# Patient Record
Sex: Female | Born: 1940 | Race: White | Hispanic: No | Smoking: Current every day smoker
Health system: Southern US, Community
[De-identification: ages and names within clinical notes are randomized; demographics above are authoritative.]

## PROBLEM LIST (undated history)

## (undated) DIAGNOSIS — M810 Age-related osteoporosis without current pathological fracture: Secondary | ICD-10-CM

## (undated) DIAGNOSIS — Z8540 Personal history of malignant neoplasm of unspecified female genital organ: Secondary | ICD-10-CM

## (undated) DIAGNOSIS — I739 Peripheral vascular disease, unspecified: Secondary | ICD-10-CM

## (undated) DIAGNOSIS — M4800 Spinal stenosis, site unspecified: Secondary | ICD-10-CM

## (undated) DIAGNOSIS — C801 Malignant (primary) neoplasm, unspecified: Secondary | ICD-10-CM

## (undated) DIAGNOSIS — E7800 Pure hypercholesterolemia, unspecified: Secondary | ICD-10-CM

## (undated) DIAGNOSIS — I10 Essential (primary) hypertension: Secondary | ICD-10-CM

## (undated) HISTORY — PX: ABDOMINAL HYSTERECTOMY: SHX81

## (undated) HISTORY — PX: OOPHORECTOMY: SHX86

---

## 1968-06-04 HISTORY — PX: BREAST BIOPSY: SHX20

## 2004-07-19 ENCOUNTER — Ambulatory Visit: Payer: Self-pay | Admitting: Oncology

## 2005-01-15 ENCOUNTER — Ambulatory Visit: Payer: Self-pay | Admitting: Oncology

## 2005-02-02 ENCOUNTER — Ambulatory Visit: Payer: Self-pay | Admitting: Oncology

## 2005-08-20 ENCOUNTER — Ambulatory Visit: Payer: Self-pay | Admitting: Oncology

## 2005-08-21 ENCOUNTER — Ambulatory Visit: Payer: Self-pay | Admitting: Unknown Physician Specialty

## 2005-08-23 ENCOUNTER — Ambulatory Visit: Payer: Self-pay | Admitting: Oncology

## 2006-02-19 ENCOUNTER — Ambulatory Visit: Payer: Self-pay | Admitting: Oncology

## 2006-03-04 ENCOUNTER — Ambulatory Visit: Payer: Self-pay | Admitting: Oncology

## 2006-08-29 ENCOUNTER — Ambulatory Visit: Payer: Self-pay | Admitting: Oncology

## 2006-09-03 ENCOUNTER — Ambulatory Visit: Payer: Self-pay | Admitting: Oncology

## 2007-02-03 ENCOUNTER — Ambulatory Visit: Payer: Self-pay | Admitting: Oncology

## 2007-02-20 ENCOUNTER — Ambulatory Visit: Payer: Self-pay | Admitting: Oncology

## 2007-03-05 ENCOUNTER — Ambulatory Visit: Payer: Self-pay | Admitting: Oncology

## 2007-12-03 ENCOUNTER — Ambulatory Visit: Payer: Self-pay | Admitting: Internal Medicine

## 2008-10-12 ENCOUNTER — Ambulatory Visit: Payer: Self-pay | Admitting: Cardiovascular Disease

## 2008-11-17 ENCOUNTER — Ambulatory Visit: Payer: Self-pay | Admitting: Internal Medicine

## 2008-12-28 ENCOUNTER — Ambulatory Visit: Payer: Self-pay | Admitting: Internal Medicine

## 2010-01-03 ENCOUNTER — Ambulatory Visit: Payer: Self-pay | Admitting: Internal Medicine

## 2011-01-31 ENCOUNTER — Ambulatory Visit: Payer: Self-pay | Admitting: Internal Medicine

## 2012-02-26 ENCOUNTER — Ambulatory Visit: Payer: Self-pay | Admitting: Internal Medicine

## 2013-02-26 ENCOUNTER — Ambulatory Visit: Payer: Self-pay | Admitting: Internal Medicine

## 2014-03-01 ENCOUNTER — Ambulatory Visit: Payer: Self-pay | Admitting: Internal Medicine

## 2015-02-21 ENCOUNTER — Other Ambulatory Visit: Payer: Self-pay | Admitting: Internal Medicine

## 2015-02-21 DIAGNOSIS — Z1231 Encounter for screening mammogram for malignant neoplasm of breast: Secondary | ICD-10-CM

## 2015-03-03 ENCOUNTER — Ambulatory Visit: Payer: Self-pay

## 2015-03-07 ENCOUNTER — Ambulatory Visit
Admission: RE | Admit: 2015-03-07 | Discharge: 2015-03-07 | Disposition: A | Discharge status: Home / Self Care | Payer: Medicare HMO | Source: Ambulatory Visit | Attending: Internal Medicine | Admitting: Internal Medicine

## 2015-03-07 ENCOUNTER — Other Ambulatory Visit: Payer: Self-pay | Admitting: Internal Medicine

## 2015-03-07 DIAGNOSIS — Z1231 Encounter for screening mammogram for malignant neoplasm of breast: Secondary | ICD-10-CM | POA: Insufficient documentation

## 2015-03-07 HISTORY — DX: Malignant (primary) neoplasm, unspecified: C80.1

## 2015-03-07 HISTORY — DX: Personal history of malignant neoplasm of unspecified female genital organ: Z85.40

## 2015-03-08 ENCOUNTER — Other Ambulatory Visit: Payer: Self-pay | Admitting: Vascular Surgery

## 2015-03-09 ENCOUNTER — Ambulatory Visit
Admission: RE | Admit: 2015-03-09 | Discharge: 2015-03-09 | Disposition: A | Discharge status: Home / Self Care | Payer: Medicare HMO | Source: Ambulatory Visit | Attending: Vascular Surgery | Admitting: Vascular Surgery

## 2015-03-09 ENCOUNTER — Encounter
Admission: RE | Disposition: A | Discharge status: Home / Self Care | Payer: Self-pay | Source: Ambulatory Visit | Attending: Vascular Surgery

## 2015-03-09 ENCOUNTER — Encounter: Payer: Self-pay | Admitting: *Deleted

## 2015-03-09 DIAGNOSIS — I10 Essential (primary) hypertension: Secondary | ICD-10-CM | POA: Insufficient documentation

## 2015-03-09 DIAGNOSIS — I70223 Atherosclerosis of native arteries of extremities with rest pain, bilateral legs: Secondary | ICD-10-CM | POA: Diagnosis not present

## 2015-03-09 DIAGNOSIS — Z79899 Other long term (current) drug therapy: Secondary | ICD-10-CM | POA: Insufficient documentation

## 2015-03-09 DIAGNOSIS — M4806 Spinal stenosis, lumbar region: Secondary | ICD-10-CM | POA: Diagnosis not present

## 2015-03-09 DIAGNOSIS — E785 Hyperlipidemia, unspecified: Secondary | ICD-10-CM | POA: Insufficient documentation

## 2015-03-09 DIAGNOSIS — F17210 Nicotine dependence, cigarettes, uncomplicated: Secondary | ICD-10-CM | POA: Diagnosis not present

## 2015-03-09 DIAGNOSIS — M810 Age-related osteoporosis without current pathological fracture: Secondary | ICD-10-CM | POA: Diagnosis not present

## 2015-03-09 DIAGNOSIS — Z7982 Long term (current) use of aspirin: Secondary | ICD-10-CM | POA: Diagnosis not present

## 2015-03-09 HISTORY — DX: Peripheral vascular disease, unspecified: I73.9

## 2015-03-09 HISTORY — DX: Spinal stenosis, site unspecified: M48.00

## 2015-03-09 HISTORY — DX: Pure hypercholesterolemia, unspecified: E78.00

## 2015-03-09 HISTORY — DX: Age-related osteoporosis without current pathological fracture: M81.0

## 2015-03-09 HISTORY — DX: Essential (primary) hypertension: I10

## 2015-03-09 HISTORY — PX: PERIPHERAL VASCULAR CATHETERIZATION: SHX172C

## 2015-03-09 LAB — CREATININE, SERUM: CREATININE: 0.8 mg/dL (ref 0.44–1.00)

## 2015-03-09 LAB — BUN: BUN: 15 mg/dL (ref 6–20)

## 2015-03-09 SURGERY — ABDOMINAL AORTOGRAM W/LOWER EXTREMITY
Laterality: Right | Wound class: Clean

## 2015-03-09 MED ORDER — ONDANSETRON HCL 4 MG/2ML IJ SOLN: 4.0000 mg | Freq: Four times a day (QID) | INTRAMUSCULAR | Status: DC | PRN

## 2015-03-09 MED ORDER — LIDOCAINE HCL (PF) 1 % IJ SOLN
INTRAMUSCULAR | Status: AC
  Filled 2015-03-09: qty 10

## 2015-03-09 MED ORDER — CEFAZOLIN SODIUM 1-5 GM-% IV SOLN
INTRAVENOUS | Status: AC
  Filled 2015-03-09: qty 50

## 2015-03-09 MED ORDER — ACETAMINOPHEN 325 MG PO TABS: 325.0000 mg | ORAL_TABLET | ORAL | Status: DC | PRN

## 2015-03-09 MED ORDER — FENTANYL CITRATE (PF) 100 MCG/2ML IJ SOLN
INTRAMUSCULAR | Status: AC
  Filled 2015-03-09: qty 4

## 2015-03-09 MED ORDER — HYDROMORPHONE HCL 1 MG/ML IJ SOLN: 0.5000 mg | INTRAMUSCULAR | Status: DC | PRN

## 2015-03-09 MED ORDER — DEXTROSE 5 % IV SOLN
INTRAVENOUS | Status: DC
Start: 2015-03-09 — End: 2015-03-09
  Filled 2015-03-09: qty 1.5

## 2015-03-09 MED ORDER — HEPARIN (PORCINE) IN NACL 2-0.9 UNIT/ML-% IJ SOLN
INTRAMUSCULAR | Status: AC
  Filled 2015-03-09: qty 1000

## 2015-03-09 MED ORDER — FENTANYL CITRATE (PF) 100 MCG/2ML IJ SOLN
INTRAMUSCULAR | Status: DC | PRN
  Administered 2015-03-09 (×2): 50 ug via INTRAVENOUS

## 2015-03-09 MED ORDER — IOHEXOL 300 MG/ML  SOLN
INTRAMUSCULAR | Status: DC | PRN
  Administered 2015-03-09: 110 mL via INTRA_ARTERIAL

## 2015-03-09 MED ORDER — CLOPIDOGREL BISULFATE 75 MG PO TABS
75.0000 mg | ORAL_TABLET | Freq: Every day | ORAL | Status: DC
Start: 2015-03-10 — End: 2016-03-08

## 2015-03-09 MED ORDER — HEPARIN SODIUM (PORCINE) 1000 UNIT/ML IJ SOLN
INTRAMUSCULAR | Status: DC | PRN
  Administered 2015-03-09: 4000 [IU] via INTRAVENOUS

## 2015-03-09 MED ORDER — CLOPIDOGREL BISULFATE 75 MG PO TABS: 300.0000 mg | ORAL_TABLET | ORAL | Status: DC

## 2015-03-09 MED ORDER — MIDAZOLAM HCL 2 MG/2ML IJ SOLN
INTRAMUSCULAR | Status: DC | PRN
  Administered 2015-03-09: 2 mg via INTRAVENOUS
  Administered 2015-03-09: 1 mg via INTRAVENOUS

## 2015-03-09 MED ORDER — OXYCODONE HCL 5 MG PO TABS: 5.0000 mg | ORAL_TABLET | ORAL | Status: DC | PRN

## 2015-03-09 MED ORDER — MIDAZOLAM HCL 5 MG/5ML IJ SOLN
INTRAMUSCULAR | Status: AC
  Filled 2015-03-09: qty 10

## 2015-03-09 MED ORDER — SODIUM CHLORIDE 0.9 % IV SOLN
INTRAVENOUS | Status: DC
  Administered 2015-03-09: 10:00:00 via INTRAVENOUS

## 2015-03-09 MED ORDER — DEXTROSE 5 % IV SOLN: 1.5000 g | INTRAVENOUS | Status: DC

## 2015-03-09 MED ORDER — ACETAMINOPHEN 325 MG RE SUPP: 325.0000 mg | RECTAL | Status: DC | PRN

## 2015-03-09 MED ORDER — HEPARIN SODIUM (PORCINE) 1000 UNIT/ML IJ SOLN
INTRAMUSCULAR | Status: AC
  Filled 2015-03-09: qty 1

## 2015-03-09 SURGICAL SUPPLY — 22 items
BALLN LUTONIX DCB 4X100X130 (BALLOONS) ×5
BALLN LUTONIX DCB 5X100X130 (BALLOONS) ×5
BALLN ULTRVRSE 3X150X150 (BALLOONS) ×5
BALLN ULTRVRSE 4X100X75C (BALLOONS) ×5
BALLOON LUTONIX DCB 4X100X130 (BALLOONS) ×3 IMPLANT
BALLOON LUTONIX DCB 5X100X130 (BALLOONS) ×3 IMPLANT
BALLOON ULTRVRSE 3X150X150 (BALLOONS) ×3 IMPLANT
BALLOON ULTRVRSE 4X100X75C (BALLOONS) ×3 IMPLANT
CATH 5FR PIG 65CM (CATHETERS) ×5 IMPLANT
CATH CXI SUPP ST 2.6FR 150CM (MICROCATHETER) ×5 IMPLANT
CATH VERT 100CM (CATHETERS) ×5 IMPLANT
DEVICE CLOSURE MYNXGRIP 6/7F (Vascular Products) ×5 IMPLANT
DEVICE PRESTO INFLATION (MISCELLANEOUS) ×5 IMPLANT
GLIDEWIRE ANGLED SS 035X260CM (WIRE) ×5 IMPLANT
PACK ANGIOGRAPHY (CUSTOM PROCEDURE TRAY) ×5 IMPLANT
SHEATH BRITE TIP 5FRX11 (SHEATH) ×5 IMPLANT
SHEATH RAABE 6FR (SHEATH) ×5 IMPLANT
SYR MEDRAD MARK V 150ML (SYRINGE) ×5 IMPLANT
TUBING CONTRAST HIGH PRESS 72 (TUBING) ×5 IMPLANT
WIRE G V18X300CM (WIRE) ×5 IMPLANT
WIRE J 3MM .035X145CM (WIRE) ×5 IMPLANT
WIRE MAGIC TORQUE 260C (WIRE) ×5 IMPLANT

## 2015-03-09 NOTE — H&P (Signed)
[facility] History & Physical Update  The patient was interviewed and re-examined.  The patient's previous History and Physical has been reviewed and is unchanged.  There is no change in the plan of care. We plan to proceed with the scheduled procedure.  Myha Arizpe, [name], MD  03/09/2015, 1:05 PM

## 2015-03-09 NOTE — Op Note (Signed)
[facility]  Percutaneous Study/Intervention Procedural Note   Date of Surgery: 03/09/2015  Surgeon:Leonel Mccollum, [name]   Pre-operative Diagnosis: Atherosclerotic occlusive disease bilateral lower extremities with lifestyle limiting claudication and mild rest pain symptoms Post-operative diagnosis:  Same  Procedure(s) Performed:  1.  Abdominal aortogram  2.  Right lower extremity distal runoff third order catheter placement  3.  Percutaneous transluminal angioplasty of the right popliteal artery  4.  Percutaneous transluminal and plasty of the left external iliac artery   Anesthesia: Conscious sedation with IV Versed plus fentanyl  Sheath: 6 French sheath left common femoral artery retrograde  Contrast: 110 cc  Fluoroscopy Time: 12.1 minutes  Indications:  Patient presented to the office with increasing pain in her lower extremities. She described multiple circumstances of lifestyle limitation. The risks and benefits for angiography with the hope for intervention were reviewed all questions been answered patient has agreed to proceed  Procedure:  Diane Rose a 74 y.o. female who was identified and appropriate procedural time out was performed.  The patient was then placed supine on the table and prepped and draped in the usual sterile fashion.  Ultrasound was used to evaluate the left common femoral artery.  It was patent.  A digital ultrasound image was acquired.  A micropuncture needle was used to access the left common femoral artery under direct ultrasound guidance and a permanent image was performed.  Microwire followed by micro-sheath was then inserted. A 0.035 J wire was advanced without resistance and a 5Fr sheath was placed.    Pigtail catheter was then advanced to the level of T12 and AP projection of the aorta was obtained. Pigtail catheter was repositioned to above the bifurcation and bilateral oblique views of the pelvis were obtained.  Using the  pigtail catheter stiff angle Glidewire the aortic bifurcation was crossed and the catheter advanced down to the distal external iliac artery. RAO projection of the femoral bifurcation was then obtained. Wire was reintroduced and the catheter wire combination was negotiated into the SFA and distal runoff was obtained.  After review these images 4000 units of heparin was given stiff Glidewire was reintroduced and the pigtail catheter 5 French sheath were removed and a 6 [place] rabies sheath was advanced up and over the bifurcation and positioned with its tip in the mid common femoral on the right. Kumpe catheter was then advanced down the wire to the level of the popliteal occlusion and using a combination of the catheter with a VAT wire and a CX thigh Cook catheter the occlusion was crossed and the catheter position was tract into the anterior tibial where hand injection contrast demonstrated intraluminal placement and distal runoff was completed. VAT wire was then reintroduced and a 3 x 15 balloon advanced across the occlusion was inflated to 12 atm for 1 minute. Follow-up imaging demonstrated there is now patency but undersized with her significant residual stenosis and therefore a 4 x 10 Lutonix balloon was advanced across the lesion inflated to 10 atm for 3 full minutes. Follow-up imaging demonstrated an excellent diameter size match with the proximal popliteal as well as the distal popliteal and maintenance of the patency of the trifurcation. No evidence of dissection less than 5% residual stenosis.  Attention was then turned to the left external iliac which demonstrated disease on the initial images the sheath was removed over wire and a 11 cm 6 French sheath was inserted. RAO projections and a magnified field were then obtained of the left  external iliac. Initially a 4 x 10 balloon was used to angioplasty the external iliac and then a 5 x 10 Lutonix. Inflation of the Lutonix balloon was to 12 atm for 3  full minutes. Follow-up imaging demonstrated excellent luminal gain with minimal residual stenosis and no evidence of dissection.  Oblique view of the left groin was then obtained in the LAO projection and a minx device deployed without difficulty  Findings:   Aortogram:  The abdominal aorta demonstrates diffuse calcifications and atherosclerotic changes however there are no hemodynamically significant lesions. There is some irregularity noted toward the aortic bifurcation.  Right Lower Extremity:  The right common and external as well as the internal iliac arteries appear patent there do not appear to be any flow limiting hemodynamically significant stenoses in both LAO and RAO projections. There is diffuse disease within the common femoral but it is not hemodynamically significant. Profunda femoris is widely patent. SFA is patent in its proximal two thirds but demonstrates increasing disease toward Hunter's canal and the popliteal occludes in its midportion remains occluded to the below knee. There is reconstitution of the last distal centimeter or so of the popliteal and the trifurcation is patent with the anterior tibial and the posterior tibial coursing down to the foot there is disease noted but is not hemodynamically significant and the posterior tibial is the dominant vessel to the foot.  Following crossing the occlusion with wire and catheter and plasty was performed and ultimately a 4 mm Lutonix yields an excellent result with minimal residual stenosis and preservation of distal runoff  Left Lower Extremity:  The left common femoral demonstrates moderate disease but does not appear to be hemodynamically significant. The left common iliac artery is patent as is the internal however the external demonstrates diffuse hemodynamically significant stenosis. Following angioplasty first to 4 and then 5 mm there is an excellent result with minimal residual stenosis.    Disposition: Patient was taken to  the recovery room in stable condition having tolerated the procedure well.  Diane Rose, [name] 03/09/2015,1:05 PM

## 2015-03-10 ENCOUNTER — Encounter: Payer: Self-pay | Admitting: Vascular Surgery

## 2016-01-17 ENCOUNTER — Other Ambulatory Visit: Payer: Self-pay | Admitting: Vascular Surgery

## 2016-01-23 ENCOUNTER — Other Ambulatory Visit
Admission: RE | Admit: 2016-01-23 | Discharge: 2016-01-23 | Disposition: A | Discharge status: Home / Self Care | Payer: Medicare HMO | Source: Ambulatory Visit | Attending: Vascular Surgery | Admitting: Vascular Surgery

## 2016-01-23 DIAGNOSIS — Z0000 Encounter for general adult medical examination without abnormal findings: Secondary | ICD-10-CM | POA: Insufficient documentation

## 2016-01-23 LAB — CREATININE, SERUM: Creatinine, Ser: 0.84 mg/dL (ref 0.44–1.00)

## 2016-01-23 LAB — BUN: BUN: 15 mg/dL (ref 6–20)

## 2016-01-24 ENCOUNTER — Ambulatory Visit
Admission: RE | Admit: 2016-01-24 | Discharge: 2016-01-24 | Disposition: A | Discharge status: Home / Self Care | Payer: Medicare HMO | Source: Ambulatory Visit | Attending: Vascular Surgery | Admitting: Vascular Surgery

## 2016-01-24 ENCOUNTER — Encounter
Admission: RE | Disposition: A | Discharge status: Home / Self Care | Payer: Self-pay | Source: Ambulatory Visit | Attending: Vascular Surgery

## 2016-01-24 DIAGNOSIS — M7989 Other specified soft tissue disorders: Secondary | ICD-10-CM | POA: Diagnosis not present

## 2016-01-24 DIAGNOSIS — F17200 Nicotine dependence, unspecified, uncomplicated: Secondary | ICD-10-CM | POA: Diagnosis not present

## 2016-01-24 DIAGNOSIS — M4806 Spinal stenosis, lumbar region: Secondary | ICD-10-CM | POA: Diagnosis not present

## 2016-01-24 DIAGNOSIS — Z8249 Family history of ischemic heart disease and other diseases of the circulatory system: Secondary | ICD-10-CM | POA: Insufficient documentation

## 2016-01-24 DIAGNOSIS — R252 Cramp and spasm: Secondary | ICD-10-CM | POA: Insufficient documentation

## 2016-01-24 DIAGNOSIS — M79609 Pain in unspecified limb: Secondary | ICD-10-CM | POA: Diagnosis not present

## 2016-01-24 DIAGNOSIS — Z90710 Acquired absence of both cervix and uterus: Secondary | ICD-10-CM | POA: Diagnosis not present

## 2016-01-24 DIAGNOSIS — I70223 Atherosclerosis of native arteries of extremities with rest pain, bilateral legs: Secondary | ICD-10-CM | POA: Diagnosis present

## 2016-01-24 DIAGNOSIS — Z7982 Long term (current) use of aspirin: Secondary | ICD-10-CM | POA: Insufficient documentation

## 2016-01-24 DIAGNOSIS — I10 Essential (primary) hypertension: Secondary | ICD-10-CM | POA: Insufficient documentation

## 2016-01-24 DIAGNOSIS — Z8342 Family history of familial hypercholesterolemia: Secondary | ICD-10-CM | POA: Insufficient documentation

## 2016-01-24 DIAGNOSIS — I739 Peripheral vascular disease, unspecified: Secondary | ICD-10-CM | POA: Diagnosis not present

## 2016-01-24 DIAGNOSIS — E785 Hyperlipidemia, unspecified: Secondary | ICD-10-CM | POA: Diagnosis not present

## 2016-01-24 DIAGNOSIS — Z90722 Acquired absence of ovaries, bilateral: Secondary | ICD-10-CM | POA: Diagnosis not present

## 2016-01-24 DIAGNOSIS — M810 Age-related osteoporosis without current pathological fracture: Secondary | ICD-10-CM | POA: Diagnosis not present

## 2016-01-24 HISTORY — PX: PERIPHERAL VASCULAR CATHETERIZATION: SHX172C

## 2016-01-24 SURGERY — LOWER EXTREMITY ANGIOGRAPHY
Anesthesia: Moderate Sedation | Laterality: Right

## 2016-01-24 MED ORDER — ACETAMINOPHEN 325 MG RE SUPP
325.0000 mg | RECTAL | Status: DC | PRN
  Filled 2016-01-24: qty 2

## 2016-01-24 MED ORDER — FENTANYL CITRATE (PF) 100 MCG/2ML IJ SOLN
INTRAMUSCULAR | Status: AC
  Filled 2016-01-24: qty 2

## 2016-01-24 MED ORDER — OXYCODONE HCL 5 MG PO TABS
5.0000 mg | ORAL_TABLET | ORAL | Status: DC | PRN
  Filled 2016-01-24: qty 2

## 2016-01-24 MED ORDER — MIDAZOLAM HCL 5 MG/5ML IJ SOLN
INTRAMUSCULAR | Status: AC
  Filled 2016-01-24: qty 5

## 2016-01-24 MED ORDER — IOPAMIDOL (ISOVUE-300) INJECTION 61%
INTRAVENOUS | Status: DC | PRN
  Administered 2016-01-24: 70 mL via INTRAVENOUS

## 2016-01-24 MED ORDER — FAMOTIDINE 20 MG PO TABS: 40.0000 mg | ORAL_TABLET | ORAL | Status: DC | PRN

## 2016-01-24 MED ORDER — CLOPIDOGREL BISULFATE 75 MG PO TABS
ORAL_TABLET | ORAL | Status: AC
  Administered 2016-01-24: 11:00:00
  Filled 2016-01-24: qty 4

## 2016-01-24 MED ORDER — SODIUM CHLORIDE 0.9 % IV SOLN
INTRAVENOUS | Status: DC
  Administered 2016-01-24 (×2): via INTRAVENOUS

## 2016-01-24 MED ORDER — ONDANSETRON HCL 4 MG/2ML IJ SOLN: 4.0000 mg | Freq: Four times a day (QID) | INTRAMUSCULAR | Status: DC | PRN

## 2016-01-24 MED ORDER — DOCUSATE SODIUM 100 MG PO CAPS: 100.0000 mg | ORAL_CAPSULE | Freq: Every day | ORAL | Status: DC

## 2016-01-24 MED ORDER — METHYLPREDNISOLONE SODIUM SUCC 125 MG IJ SOLR: 125.0000 mg | INTRAMUSCULAR | Status: DC | PRN

## 2016-01-24 MED ORDER — HEPARIN SODIUM (PORCINE) 1000 UNIT/ML IJ SOLN
INTRAMUSCULAR | Status: AC
  Filled 2016-01-24: qty 1

## 2016-01-24 MED ORDER — HYDROMORPHONE HCL 1 MG/ML IJ SOLN: 1.0000 mg | Freq: Once | INTRAMUSCULAR | Status: DC

## 2016-01-24 MED ORDER — HEPARIN SODIUM (PORCINE) 1000 UNIT/ML IJ SOLN
INTRAMUSCULAR | Status: DC | PRN
  Administered 2016-01-24: 4000 [IU] via INTRAVENOUS

## 2016-01-24 MED ORDER — FENTANYL CITRATE (PF) 100 MCG/2ML IJ SOLN
INTRAMUSCULAR | Status: DC | PRN
  Administered 2016-01-24: 25 ug via INTRAVENOUS
  Administered 2016-01-24: 50 ug via INTRAVENOUS
  Administered 2016-01-24: 25 ug via INTRAVENOUS

## 2016-01-24 MED ORDER — HYDRALAZINE HCL 20 MG/ML IJ SOLN: 5.0000 mg | INTRAMUSCULAR | Status: DC | PRN

## 2016-01-24 MED ORDER — CEFUROXIME SODIUM 1.5 G IJ SOLR
1.5000 g | INTRAMUSCULAR | Status: AC
  Administered 2016-01-24: 1.5 g via INTRAVENOUS

## 2016-01-24 MED ORDER — ASPIRIN EC 325 MG PO TBEC
DELAYED_RELEASE_TABLET | ORAL | Status: AC
  Administered 2016-01-24: 11:00:00
  Filled 2016-01-24: qty 1

## 2016-01-24 MED ORDER — PANTOPRAZOLE SODIUM 40 MG PO TBEC: 40.0000 mg | DELAYED_RELEASE_TABLET | Freq: Every day | ORAL | Status: DC

## 2016-01-24 MED ORDER — ALUM & MAG HYDROXIDE-SIMETH 200-200-20 MG/5ML PO SUSP: 15.0000 mL | ORAL | Status: DC | PRN

## 2016-01-24 MED ORDER — CLOPIDOGREL BISULFATE 75 MG PO TABS: 75.0000 mg | ORAL_TABLET | Freq: Every day | ORAL | 4 refills | Status: DC

## 2016-01-24 MED ORDER — MIDAZOLAM HCL 2 MG/2ML IJ SOLN
INTRAMUSCULAR | Status: DC | PRN
  Administered 2016-01-24: 2 mg via INTRAVENOUS
  Administered 2016-01-24: 1 mg via INTRAVENOUS

## 2016-01-24 MED ORDER — ACETAMINOPHEN 325 MG PO TABS: 325.0000 mg | ORAL_TABLET | ORAL | Status: DC | PRN

## 2016-01-24 MED ORDER — HEPARIN (PORCINE) IN NACL 2-0.9 UNIT/ML-% IJ SOLN
INTRAMUSCULAR | Status: AC
  Filled 2016-01-24: qty 1000

## 2016-01-24 MED ORDER — METOPROLOL TARTRATE 5 MG/5ML IV SOLN: 5.0000 mg | Freq: Four times a day (QID) | INTRAVENOUS | Status: DC

## 2016-01-24 MED ORDER — LIDOCAINE HCL (PF) 1 % IJ SOLN
INTRAMUSCULAR | Status: AC
  Filled 2016-01-24: qty 30

## 2016-01-24 MED ORDER — LABETALOL HCL 5 MG/ML IV SOLN: 10.0000 mg | INTRAVENOUS | Status: DC | PRN

## 2016-01-24 MED ORDER — MORPHINE SULFATE (PF) 4 MG/ML IV SOLN: 2.0000 mg | INTRAVENOUS | Status: DC | PRN

## 2016-01-24 SURGICAL SUPPLY — 28 items
BALLN LUTONIX 4X150X130 (BALLOONS) ×3
BALLN ULTRVRSE 2.5X300X150 (BALLOONS) ×3
BALLN ULTRVRSE 3X220X150 (BALLOONS) ×3
BALLOON LUTONIX 4X150X130 (BALLOONS) ×1 IMPLANT
BALLOON ULTRVRSE 2.5X300X150 (BALLOONS) ×1 IMPLANT
BALLOON ULTRVRSE 3X220X150 (BALLOONS) ×1 IMPLANT
CATH CROSSER S6 154CM (CATHETERS) ×3 IMPLANT
CATH G STR 4FX120.038 (CATHETERS) ×3 IMPLANT
CATH PIG 70CM (CATHETERS) ×3 IMPLANT
CATH USHER ANGLED 130CM (CATHETERS) ×3 IMPLANT
DEVICE PRESTO INFLATION (MISCELLANEOUS) ×3 IMPLANT
DEVICE STARCLOSE SE CLOSURE (Vascular Products) ×3 IMPLANT
DEVICE TORQUE (MISCELLANEOUS) ×3 IMPLANT
GLIDEWIRE ADV .035X180CM (WIRE) ×3 IMPLANT
GLIDEWIRE ANGLED SS 035X260CM (WIRE) ×3 IMPLANT
KIT FLOWMATE PROCEDURAL (MISCELLANEOUS) ×3 IMPLANT
PACK ANGIOGRAPHY (CUSTOM PROCEDURE TRAY) ×3 IMPLANT
SET INTRO CAPELLA COAXIAL (SET/KITS/TRAYS/PACK) ×3 IMPLANT
SHEATH ANL2 6FRX45 HC (SHEATH) ×3 IMPLANT
SHEATH BRITE TIP 5FRX11 (SHEATH) ×3 IMPLANT
SHEATH BRITE TIP 6FRX11 (SHEATH) ×3 IMPLANT
SHEATH BRITE TIP 7FRX11 (SHEATH) ×3 IMPLANT
SHIELD RADPAD SCOOP 12X17 (MISCELLANEOUS) ×3 IMPLANT
STENT LIFESTREAM 6X58X80 (Permanent Stent) ×3 IMPLANT
SYR MEDRAD MARK V 150ML (SYRINGE) ×3 IMPLANT
TUBING CONTRAST HIGH PRESS 72 (TUBING) ×3 IMPLANT
WIRE G V18X300CM (WIRE) ×3 IMPLANT
WIRE J 3MM .035X145CM (WIRE) ×3 IMPLANT

## 2016-01-24 NOTE — Discharge Instructions (Signed)

## 2016-01-24 NOTE — Op Note (Signed)
[facility] Percutaneous Study/Intervention Procedural Note   Date of Surgery: 01/24/2016  Surgeon:  Katha Cabal, MD.  Pre-operative Diagnosis: Atherosclerotic occlusive disease bilateral lower extremities with rest pain of the right lower extremity  Post-operative diagnosis: Same  Procedure(s) Performed: 1. Introduction catheter into right lower extremity 3rd order catheter placement  2. Contrast injection right lower extremity for distal runoff   3. Crosser atherectomy of the right SFA and popliteal arteries 4.  Percutaneous transluminal angioplasty right superficial femoral artery and popliteal             5.   Percutaneous transluminal angioplasty of the right peroneal and tibioperoneal trunk             6.   Percutaneous transluminal angioplasty and stent placement left external iliac artery             7.   Star close closure left common femoral arteriotomy  Anesthesia: Conscious sedation was administered under my direct supervision. IV Versed plus fentanyl were utilized. Continuous ECG, pulse oximetry and blood pressure was monitored throughout the entire procedure. Conscious sedation was for a total of 100 minutes.  Sheath: 6 [place] Ansell left common femoral  Contrast: 70 cc  Fluoroscopy Time: 12.4 minutes  Indications: Diane Rose presents with rest pain of the right lower extremity. The risks and benefits are reviewed all questions answered patient agrees to proceed.  Procedure: Diane Rose is a 75 y.o. y.o. female who was identified and appropriate procedural time out was performed. The patient was then placed supine on the table and prepped and draped in the usual sterile fashion.   Ultrasound was placed in the sterile sleeve and the left groin was evaluated the left common femoral artery was echolucent and pulsatile indicating patency.  Image was recorded for the permanent  record and under real-time visualization a microneedle was inserted into the common femoral artery microwire followed by a micro-sheath.  A J-wire was then advanced through the micro-sheath and a  5 [place] sheath was then inserted over a J-wire. J-wire was then advanced and a 5 French pigtail catheter was positioned at the level of T12. AP projection of the aorta was then obtained. Pigtail catheter was repositioned to above the bifurcation and a LAO and RAO view of the pelvis was obtained.  Subsequently a pigtail catheter with the stiff angle Glidewire was used to cross the aortic bifurcation the catheter wire were advanced down into the right distal external iliac artery. Oblique view of the femoral bifurcation was then obtained and subsequently the wire was reintroduced and the pigtail catheter negotiated into the SFA representing third order catheter placement. Distal runoff was then performed.  4000 units of heparin was then given and allowed to circulate and a 6 [place] Ansell sheath was advanced up and over the bifurcation and positioned in the femoral artery  The SFA 6 Crosser catheter was then prepped on the field and a angled Usher catheter was advanced into the cul-de-sac of the SFA under magnified imaging in the LAO projection. Using the Crosser catheter the occlusion of the SFA and popliteal was negotiated.  Angled Usher catheter was then advanced down into the peroneal.  Distal runoff was then completed by hand injection through the catheter.s negotiated and hand injection of contrast was used to verify intraluminal placement distally.    A 2.5 x 300 mm balloon was used to angioplasty the proximal peroneal and tibioperoneal trunk. Inflations were to 12 atmospheres for 1  minute. Follow-up imaging demonstrated patency with inadequate preparation of the vessel.  Therefore a 3.0 x 200 mm balloon was delivered down to the peroneal and a second inflation to 12 atm for 1 minute was performed.  Subsequently, a 4 x 15 Lutonix balloon was advanced across the distal SFA and popliteal and inflated to 12 atm for 2 full minutes. Follow-up imaging demonstrated less than 15% residual stenosis and therefore a stent was not indicated. Distal runoff was reassessed and was preserved  After review of these images the sheath is pulled into the left distal external iliac oblique of the external iliac and common femoral is obtained. A 6 x 58 Lifestream stent was then selected and deployed with a single inflation through the majority of the left external iliac artery. Follow-up imaging demonstrated excellent result and subsequently a Star close device deployed. There no immediate Complications.  Findings: The abdominal aorta is opacified with a bolus injection contrast. Renal arteries are single and patent bilaterally. The aorta itself has diffuse disease but no hemodynamically significant lesions. The common and external iliac arteries are patent bilaterally.  The right common femoral is widely patent as is the profunda femoris.  The SFA does indeed occlude at the level of Hunter's canal the entirety the popliteal is occluded as is the tibioperoneal trunk and origin of the peroneal.  Following angioplasty to 2.5 mm within the peroneal and tibioperoneal trunk imaging demonstrates this area is still undersized and therefore a second angioplasty of 3 mm performed with an excellent result. Subsequently the SFA and popliteal is angioplastied to 4 mm using a Lutonix balloon with an excellent result. Distal runoff is preserved and appears to be 2 vessel.  Following imaging of the left external iliac artery there is a high-grade lesion present and diffuse high-grade stenosis. Therefore a Lifestream stent is deployed and dilated to 6 mm. Excellent result is noted here on follow-up imaging.  Disposition: Patient was taken to the recovery room in stable condition having tolerated the procedure  well.  Diane Rose, Diane Rose 01/24/2016,4:28 PM

## 2016-01-24 NOTE — H&P (Signed)
[facility] History & Physical Update  The patient was interviewed and re-examined.  The patient's previous History and Physical has been reviewed and is unchanged.  There is no change in the plan of care. We plan to proceed with the scheduled procedure.  Schnier, [name], MD  01/24/2016, 7:59 AM

## 2016-01-25 ENCOUNTER — Encounter: Payer: Self-pay | Admitting: Vascular Surgery

## 2016-03-07 ENCOUNTER — Other Ambulatory Visit (INDEPENDENT_AMBULATORY_CARE_PROVIDER_SITE_OTHER): Payer: Self-pay | Admitting: Vascular Surgery

## 2016-03-07 DIAGNOSIS — I70221 Atherosclerosis of native arteries of extremities with rest pain, right leg: Secondary | ICD-10-CM

## 2016-03-07 DIAGNOSIS — I70203 Unspecified atherosclerosis of native arteries of extremities, bilateral legs: Secondary | ICD-10-CM

## 2016-03-08 ENCOUNTER — Ambulatory Visit (INDEPENDENT_AMBULATORY_CARE_PROVIDER_SITE_OTHER): Payer: Medicare HMO

## 2016-03-08 ENCOUNTER — Encounter (INDEPENDENT_AMBULATORY_CARE_PROVIDER_SITE_OTHER): Payer: Self-pay | Admitting: Vascular Surgery

## 2016-03-08 ENCOUNTER — Ambulatory Visit (INDEPENDENT_AMBULATORY_CARE_PROVIDER_SITE_OTHER): Payer: Medicare HMO | Admitting: Vascular Surgery

## 2016-03-08 VITALS — BP 134/62 | HR 69 | Resp 16 | Ht 59.0 in | Wt 111.0 lb

## 2016-03-08 DIAGNOSIS — I70221 Atherosclerosis of native arteries of extremities with rest pain, right leg: Secondary | ICD-10-CM

## 2016-03-08 DIAGNOSIS — M7989 Other specified soft tissue disorders: Secondary | ICD-10-CM | POA: Diagnosis not present

## 2016-03-08 DIAGNOSIS — M48062 Spinal stenosis, lumbar region with neurogenic claudication: Secondary | ICD-10-CM

## 2016-03-08 DIAGNOSIS — I70213 Atherosclerosis of native arteries of extremities with intermittent claudication, bilateral legs: Secondary | ICD-10-CM

## 2016-03-08 DIAGNOSIS — M79605 Pain in left leg: Secondary | ICD-10-CM | POA: Diagnosis not present

## 2016-03-08 DIAGNOSIS — M79604 Pain in right leg: Secondary | ICD-10-CM

## 2016-03-08 DIAGNOSIS — I70203 Unspecified atherosclerosis of native arteries of extremities, bilateral legs: Secondary | ICD-10-CM

## 2016-03-10 DIAGNOSIS — I70213 Atherosclerosis of native arteries of extremities with intermittent claudication, bilateral legs: Secondary | ICD-10-CM | POA: Insufficient documentation

## 2016-03-10 DIAGNOSIS — M79609 Pain in unspecified limb: Secondary | ICD-10-CM | POA: Insufficient documentation

## 2016-03-10 DIAGNOSIS — M7989 Other specified soft tissue disorders: Secondary | ICD-10-CM | POA: Insufficient documentation

## 2016-03-10 DIAGNOSIS — M48062 Spinal stenosis, lumbar region with neurogenic claudication: Secondary | ICD-10-CM | POA: Insufficient documentation

## 2016-03-10 NOTE — Progress Notes (Signed)
MRN : [id number]  Diane Rose is a 75 y.o. (22-Aug-1940) female who presents with chief complaint of  Chief Complaint  Patient presents with  . Follow-up  .  History of Present Illness:  The patient returns to the office for followup and review of the noninvasive studies. There have been no interval changes in lower extremity symptoms. No interval shortening of the patient's claudication distance.  No development of rest pain symptoms. No new ulcers or wounds have occurred since the last visit.  There have been no significant changes to the patient's overall health care.  The patient denies amaurosis fugax or recent TIA symptoms. There are no recent neurological changes noted. The patient denies history of DVT, PE or superficial thrombophlebitis. The patient denies recent episodes of angina or shortness of breath.    ABI's Rt=0.99 and Lt=0.97  (previous study showed Rt=0.54 and Lt=0.94) Duplex ultrasound of the right leg arterial study shows the SFA/pop remains patent  Current Outpatient Prescriptions  Medication Sig Dispense Refill  . amLODipine (NORVASC) 5 MG tablet     . aspirin 81 MG tablet Take 81 mg by mouth daily.    . cholecalciferol (VITAMIN D) 400 UNITS TABS tablet Take 400 Units by mouth.    . clopidogrel (PLAVIX) 75 MG tablet Take 1 tablet (75 mg total) by mouth daily. 30 tablet 4  . gabapentin (NEURONTIN) 100 MG capsule Take 100 mg by mouth daily.    . hydrochlorothiazide (HYDRODIURIL) 25 MG tablet Take 25 mg by mouth daily.    . methylphenidate (METADATE CD) 60 MG CR capsule Take 60 mg by mouth every morning.    . Multiple Vitamin (MULTIVITAMIN) capsule Take 1 capsule by mouth daily.    . simvastatin (ZOCOR) 20 MG tablet Take 20 mg by mouth daily.     No current facility-administered medications for this visit.     Past Medical History:  Diagnosis Date  . Cancer ([place])    uterine 1971  . 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  Hypercholesteremia   . Hypertension   . Osteoporosis   . Peripheral vascular disease ([place])   . Spinal stenosis     Past Surgical History:  Procedure Laterality Date  . ABDOMINAL HYSTERECTOMY    . BREAST BIOPSY Left 1970   neg  . PERIPHERAL VASCULAR CATHETERIZATION N/A 03/09/2015   Procedure: Abdominal Aortogram w/Lower Extremity;  Surgeon: Katha Cabal, MD;  Location: [facility];  Service: Cardiovascular;  Laterality: N/A;  . PERIPHERAL VASCULAR CATHETERIZATION  03/09/2015   Procedure: Lower Extremity Intervention;  Surgeon: Katha Cabal, MD;  Location: [facility];  Service: Cardiovascular;;  . PERIPHERAL VASCULAR CATHETERIZATION Right 01/24/2016   Procedure: Lower Extremity Angiography;  Surgeon: Katha Cabal, MD;  Location: [facility];  Service: Cardiovascular;  Laterality: Right;    Social History Social History  Substance Use Topics  . Smoking status: Current Every Day Smoker    Packs/day: 1.00    Years: 62.00    Types: Cigarettes  . Smokeless tobacco: Never Used  . Alcohol use No     Family History History reviewed. No pertinent family history.   No Known Allergies   REVIEW OF SYSTEMS (Negative unless checked)  Constitutional: [] Weight loss  [] Fever  [] Chills Cardiac: [] Chest pain   [] Chest pressure   [] Palpitations   [] Shortness of breath when laying flat   [] Shortness of breath at rest   [] Shortness of  breath with exertion. Vascular:  [] Pain in legs with walking   [] Pain in legs at rest   [] History of DVT   [] Phlebitis   [] Swelling in legs   [] Varicose veins Pulmonary:   [] Uses home oxygen   [] Productive cough   [] Hemoptysis   [] Wheeze  [] COPD   [] Asthma Neurologic:  [] Dizziness  [] Blackouts   [] Seizures   [] History of stroke   [] History of TIA  [] Aphasia   [] Temporary blindness   [] Dysphagia   [] Weakness or numbness in arms   [] Weakness or numbness in legs Musculoskeletal:  [] Arthritis   [] Joint swelling   [] Joint pain    [] Low back pain Hematologic:  [] Easy bruising  [] Easy bleeding   [] Hypercoagulable state   [] Anemic   Gastrointestinal:   [] Vomiting  [] Gastroesophageal reflux/heartburn   [] Abdominal pain Genitourinary:  [] Chronic kidney disease   [] Difficult urination  [] Frequent urination  [] Burning with urination   [] Hematuria Skin:  [] Rashes   [] Ulcers   [] Wounds Psychological:  []  anxiety   []  depression.  Physical Examination  BP 134/62 (BP Location: Right Arm)   Pulse 69   Resp 16   Ht 4' 11"  (1.499 m)   Wt 111 lb (50.3 kg)   BMI 22.42 kg/m  Gen:  WD/WN, NAD Head: Pace/AT, No temporalis wasting. Ear/Nose/Throat: Hearing grossly intact, nares w/o erythema or drainage, trachea midline Eyes: PERRLA.  Sclera non-icteric Neck: Supple.  No JVD.  Pulmonary:  Good air movement, no use of accessory muscles. Clear bilaterally Cardiac: RRR, normal S1, S2 Vascular:  Rapid capillary refill bilaterally 1+ edema both ankles Vessel Right Left  Radial Palpable Palpable  Ulnar Palpable Palpable  Brachial Palpable Palpable  Carotid Palpable, without bruit Palpable, without bruit  Femoral Palpable Palpable  Popliteal Palpable Palpable  PT 1+Palpable 1+Palpable  DP 1+Palpable 1+Palpable   Gastrointestinal: Non-tender/non-distended. No guarding.  Musculoskeletal: M/S 5/5 throughout.  No deformity or atrophy.  Neurologic: CN 2-12 intact. Pain and light touch intact in extremities.  Symmetrical.  Speech is fluent.  Psychiatric: Judgment intact, Mood & affect appropriate for pt's clinical situation. Dermatologic: No rashes or ulcers noted.  No cellulitis or open wounds.    Labs Recent Results (from the past 2160 hour(s))  BUN     Status: None   Collection Time: 01/23/16  9:39 AM  Result Value Ref Range   BUN 15 6 - 20 mg/dL  Creatinine, serum     Status: None   Collection Time: 01/23/16  9:39 AM  Result Value Ref Range   Creatinine, Ser 0.84 0.44 - 1.00 mg/dL   GFR calc non Af Amer >60 >60 mL/min    GFR calc Af Amer >60 >60 mL/min    Comment: (NOTE) The eGFR has been calculated using the CKD EPI equation. This calculation has not been validated in all clinical situations. eGFR's persistently <60 mL/min signify possible Chronic Kidney Disease.     Radiology No results found.  Assessment/Plan  1.  Atherosclerotic PVD with intermittent claudication (440.21  I73.9):  Recommend:  The patient has evidence of atherosclerosis of the lower extremities with claudication that is improved clinicaly after angiogram and intervention. The patient does not voice lifestyle limiting changes at this point in time.  Noninvasive studies do not suggest clinically significant change. Right SFA/pop remains patent, ABI's are within normal range  No invasive studies, angiography or surgery at this time The patient should continue walking and begin a more formal exercise program. The patient should continue antiplatelet therapy and aggressive treatment  of the lipid abnormalities  The patient should continue wearing graduated compression socks 10-15 mmHg strength to control the mild edema.  2.  Swelling of limb (729.81  M79.89)  3.  Pain in limb (729.5  M79.609)  4.  Spinal stenosis of lumbar region without neurogenic claudication (724.02  M48.06) Impression: She did seek a second opinion regarding her spine disease and surgery is not recommended again at this time  5.  Hypertension, essential, benign (401.1  I10) Impression: Given patient's atherosclerosis and PAD optimal control of the patient's hypertension is important.  BP today was acceptable Therefore the patient will continue the current medications  The primary medical service will continue aggressive antihypertensive therapy as per the AHA guidelines  6.  Hyperlipidemia (272.4  E78.5) Impression: Given the patient's atherosclerosis management and optimization of the lipid profile must be done.  Therefore the patient will  continue, statin therapy and a healthy heart diet This was discussed with the patient.  The primary medical service will continue aggressive therapy and manage the medications  7.  Osteoporosis (733.00  M81.0)   Hortencia Pilar, MD  03/10/2016 2:02 PM    This note was created with Dragon medical transcription system.  Any errors from dictation are purely unintentional

## 2016-03-27 ENCOUNTER — Other Ambulatory Visit: Payer: Self-pay | Admitting: Internal Medicine

## 2016-03-27 DIAGNOSIS — Z1231 Encounter for screening mammogram for malignant neoplasm of breast: Secondary | ICD-10-CM

## 2016-03-29 ENCOUNTER — Ambulatory Visit
Admission: RE | Admit: 2016-03-29 | Discharge: 2016-03-29 | Disposition: A | Discharge status: Home / Self Care | Payer: Medicare HMO | Source: Ambulatory Visit | Attending: Internal Medicine | Admitting: Internal Medicine

## 2016-03-29 ENCOUNTER — Other Ambulatory Visit: Payer: Self-pay | Admitting: Internal Medicine

## 2016-03-29 DIAGNOSIS — Z1231 Encounter for screening mammogram for malignant neoplasm of breast: Secondary | ICD-10-CM | POA: Insufficient documentation

## 2016-06-28 ENCOUNTER — Other Ambulatory Visit (INDEPENDENT_AMBULATORY_CARE_PROVIDER_SITE_OTHER): Payer: Self-pay | Admitting: Vascular Surgery

## 2016-09-06 ENCOUNTER — Encounter (INDEPENDENT_AMBULATORY_CARE_PROVIDER_SITE_OTHER): Payer: Self-pay | Admitting: Vascular Surgery

## 2016-09-06 ENCOUNTER — Ambulatory Visit (INDEPENDENT_AMBULATORY_CARE_PROVIDER_SITE_OTHER): Payer: Medicare Other

## 2016-09-06 ENCOUNTER — Ambulatory Visit (INDEPENDENT_AMBULATORY_CARE_PROVIDER_SITE_OTHER): Payer: Medicare Other | Admitting: Vascular Surgery

## 2016-09-06 VITALS — BP 113/62 | HR 67 | Resp 16 | Ht 58.5 in | Wt 111.0 lb

## 2016-09-06 DIAGNOSIS — M79605 Pain in left leg: Secondary | ICD-10-CM | POA: Diagnosis not present

## 2016-09-06 DIAGNOSIS — I739 Peripheral vascular disease, unspecified: Secondary | ICD-10-CM | POA: Insufficient documentation

## 2016-09-06 DIAGNOSIS — I70213 Atherosclerosis of native arteries of extremities with intermittent claudication, bilateral legs: Secondary | ICD-10-CM

## 2016-09-06 DIAGNOSIS — M79604 Pain in right leg: Secondary | ICD-10-CM

## 2016-09-06 DIAGNOSIS — M7989 Other specified soft tissue disorders: Secondary | ICD-10-CM | POA: Diagnosis not present

## 2016-09-06 NOTE — Progress Notes (Signed)
Subjective:    Patient ID: Diane Rose, female    DOB: February 21, 1941, 76 y.o.   MRN: [id number] Chief Complaint  Patient presents with  . Re-evaluation    6 month ultrasound follow up   Patient presents for a six month lower extremity atherosclerotic disease follow up. The patient underwent an ABI which showed Right ABI: 0.76 and Left 0.83 (on 03/08/16, Right ABI: 0.98 and Left 0.97). A bilateral lower extremity arterial duplex is notable for Right: Triphasic through thigh, monophasic in the calf / Left: triphasic throughout. The patient denies any claudication like symptoms, rest pain or ulcers to her feet / toes.    Review of Systems  All other systems reviewed and are negative.     Objective:   Physical Exam  Constitutional: She is oriented to person, place, and time. She appears well-developed and well-nourished. No distress.  HENT:  Head: Normocephalic and atraumatic.  Eyes: Conjunctivae are normal. Pupils are equal, round, and reactive to light.  Neck: Normal range of motion.  Cardiovascular: Normal rate, regular rhythm, normal heart sounds and intact distal pulses.   Pulses:      Radial pulses are 2+ on the right side, and 2+ on the left side.       Dorsalis pedis pulses are 1+ on the right side, and 2+ on the left side.       Posterior tibial pulses are 1+ on the right side, and 2+ on the left side.  Pulmonary/Chest: Effort normal.  Musculoskeletal: Normal range of motion. She exhibits no edema.  Neurological: She is alert and oriented to person, place, and time.  Skin: Skin is warm and dry. She is not diaphoretic.  Psychiatric: She has a normal mood and affect. Her behavior is normal. Judgment and thought content normal.  Vitals reviewed.  BP 113/62 (BP Location: Right Arm)   Pulse 67   Resp 16   Ht 4' 10.5" (1.486 m)   Wt 111 lb (50.3 kg)   BMI 22.80 kg/m   Past Medical History:  Diagnosis Date  . Cancer ([place])    uterine 1971  . 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 Hypercholesteremia   . Hypertension   . Osteoporosis   . Peripheral vascular disease ([place])   . Spinal stenosis    Social History   Social History  . Marital status: Married    Spouse name: N/A  . Number of children: N/A  . Years of education: N/A   Occupational History  . Not on file.   Social History Main Topics  . Smoking status: Current Every Day Smoker    Packs/day: 1.00    Years: 62.00    Types: Cigarettes  . Smokeless tobacco: Never Used  . Alcohol use No  . Drug use: No  . Sexual activity: Not on file   Other Topics Concern  . Not on file   Social History Narrative  . No narrative on file   Past Surgical History:  Procedure Laterality Date  . ABDOMINAL HYSTERECTOMY    . BREAST BIOPSY Left 1970   neg  . PERIPHERAL VASCULAR CATHETERIZATION N/A 03/09/2015   Procedure: Abdominal Aortogram w/Lower Extremity;  Surgeon: Katha Cabal, MD;  Location: [facility];  Service: Cardiovascular;  Laterality: N/A;  . PERIPHERAL VASCULAR CATHETERIZATION  03/09/2015   Procedure: Lower Extremity Intervention;  Surgeon: Katha Cabal, MD;  Location: [facility];  Service: Cardiovascular;;  . PERIPHERAL VASCULAR  CATHETERIZATION Right 01/24/2016   Procedure: Lower Extremity Angiography;  Surgeon: Katha Cabal, MD;  Location: [facility];  Service: Cardiovascular;  Laterality: Right;   No family history on file.  No Known Allergies     Assessment & Plan:  Patient presents for a six month lower extremity atherosclerotic disease follow up. The patient underwent an ABI which showed Right ABI: 0.76 and Left 0.83 (on 03/08/16, Right ABI: 0.98 and Left 0.97). A bilateral lower extremity arterial duplex is notable for Right: Triphasic through thigh, monophasic in the calf / Left: triphasic throughout. The patient denies any claudication like symptoms, rest pain or ulcers to her feet / toes.   1. PAD (peripheral artery  disease) (HCC) - Stable Decrease in ABI Stenosis noted in RLE on duplex. Patient is currently asymptomatic. No intervention at this time as patient is asymptomatic. Will bring patient back in six month for PAD follow up.  I have discussed with the patient at length the risk factors for and pathogenesis of atherosclerotic disease and encouraged a healthy diet, regular exercise regimen and blood pressure / glucose control.  The patient was encouraged to call the office in the interim if he experiences any claudication like symptoms, rest pain or ulcers to his feet / toes.  - VAS [place] ABI WITH/WO TBI; Future - VAS [place] LOWER EXTREMITY ARTERIAL DUPLEX; Future  2. Pain in both lower extremities - Improvement As above.  3. Swelling of limb - None Patient is currently asymptomatic.  Current Outpatient Prescriptions on File Prior to Visit  Medication Sig Dispense Refill  . aspirin 81 MG tablet Take 81 mg by mouth daily.    . cholecalciferol (VITAMIN D) 400 UNITS TABS tablet Take 400 Units by mouth.    . clopidogrel (PLAVIX) 75 MG tablet TAKE ONE TABLET BY MOUTH ONCE DAILY 30 tablet 4  . gabapentin (NEURONTIN) 100 MG capsule Take 100 mg by mouth daily.    . hydrochlorothiazide (HYDRODIURIL) 25 MG tablet Take 25 mg by mouth daily.    . methylphenidate (METADATE CD) 60 MG CR capsule Take 60 mg by mouth every morning.    . Multiple Vitamin (MULTIVITAMIN) capsule Take 1 capsule by mouth daily.    . simvastatin (ZOCOR) 20 MG tablet Take 20 mg by mouth daily.     No current facility-administered medications on file prior to visit.     There are no Patient Instructions on file for this visit. No Follow-up on file.   Yaniel Limbaugh A Dhyana Bastone, PA-C

## 2017-02-19 ENCOUNTER — Other Ambulatory Visit: Payer: Self-pay | Admitting: Internal Medicine

## 2017-02-19 DIAGNOSIS — Z1231 Encounter for screening mammogram for malignant neoplasm of breast: Secondary | ICD-10-CM

## 2017-03-11 ENCOUNTER — Ambulatory Visit (INDEPENDENT_AMBULATORY_CARE_PROVIDER_SITE_OTHER): Payer: Medicare Other

## 2017-03-11 ENCOUNTER — Encounter (INDEPENDENT_AMBULATORY_CARE_PROVIDER_SITE_OTHER): Payer: Self-pay | Admitting: Vascular Surgery

## 2017-03-11 ENCOUNTER — Ambulatory Visit (INDEPENDENT_AMBULATORY_CARE_PROVIDER_SITE_OTHER): Payer: Medicare Other | Admitting: Vascular Surgery

## 2017-03-11 VITALS — BP 125/65 | HR 67 | Resp 16 | Ht 59.0 in | Wt 109.0 lb

## 2017-03-11 DIAGNOSIS — I10 Essential (primary) hypertension: Secondary | ICD-10-CM

## 2017-03-11 DIAGNOSIS — I739 Peripheral vascular disease, unspecified: Secondary | ICD-10-CM

## 2017-03-11 DIAGNOSIS — M79605 Pain in left leg: Secondary | ICD-10-CM

## 2017-03-11 DIAGNOSIS — M7989 Other specified soft tissue disorders: Secondary | ICD-10-CM

## 2017-03-11 DIAGNOSIS — M79604 Pain in right leg: Secondary | ICD-10-CM

## 2017-03-11 DIAGNOSIS — M48062 Spinal stenosis, lumbar region with neurogenic claudication: Secondary | ICD-10-CM

## 2017-03-11 DIAGNOSIS — I70213 Atherosclerosis of native arteries of extremities with intermittent claudication, bilateral legs: Secondary | ICD-10-CM

## 2017-03-11 DIAGNOSIS — E782 Mixed hyperlipidemia: Secondary | ICD-10-CM

## 2017-03-16 DIAGNOSIS — I10 Essential (primary) hypertension: Secondary | ICD-10-CM | POA: Insufficient documentation

## 2017-03-16 DIAGNOSIS — E785 Hyperlipidemia, unspecified: Secondary | ICD-10-CM | POA: Insufficient documentation

## 2017-03-16 NOTE — Progress Notes (Signed)
MRN : [id number]  Diane Rose is a 76 y.o. ([birth year]) female who presents with chief complaint of  Chief Complaint  Patient presents with  . Follow-up    68mo abi,ble art  .  History of Present Illness: The patient returns to the office for followup and review of the noninvasive studies. There have been no interval changes in lower extremity symptoms. No interval shortening of the patient's claudication distance or development of rest pain symptoms. No new ulcers or wounds have occurred since the last visit.  There have been no significant changes to the patient's overall health care.  The patient denies amaurosis fugax or recent TIA symptoms. There are no recent neurological changes noted. The patient denies history of DVT, PE or superficial thrombophlebitis. The patient denies recent episodes of angina or shortness of breath.   ABI Rt=0.87 and Lt=1.09  (previous ABI's Rt=0.76 and Lt=0.81) Duplex ultrasound of the right leg arterial shows a right SFA lesion and a tp trunk lesion Current Meds  Medication Sig  . amLODipine (NORVASC) 10 MG tablet   . aspirin 81 MG tablet Take 81 mg by mouth daily.  . Aspirin-Calcium Carbonate 81-777 MG TABS Take by mouth.  . cholecalciferol (VITAMIN D) 400 UNITS TABS tablet Take 400 Units by mouth.  . clopidogrel (PLAVIX) 75 MG tablet TAKE ONE TABLET BY MOUTH ONCE DAILY  . Multiple Vitamin (MULTIVITAMIN) capsule Take 1 capsule by mouth daily.  . simvastatin (ZOCOR) 20 MG tablet Take 20 mg by mouth daily.    Past Medical History:  Diagnosis Date  . Cancer ([place])    uterine 1971  . 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 Hypercholesteremia   . Hypertension   . Osteoporosis   . Peripheral vascular disease ([place])   . Spinal stenosis     Past Surgical History:  Procedure Laterality Date  . ABDOMINAL HYSTERECTOMY    . BREAST BIOPSY Left 1970   neg  . PERIPHERAL VASCULAR CATHETERIZATION N/A 03/09/2015   Procedure:  Abdominal Aortogram w/Lower Extremity;  Surgeon: Katha Cabal, MD;  Location: [facility];  Service: Cardiovascular;  Laterality: N/A;  . PERIPHERAL VASCULAR CATHETERIZATION  03/09/2015   Procedure: Lower Extremity Intervention;  Surgeon: Katha Cabal, MD;  Location: [facility];  Service: Cardiovascular;;  . PERIPHERAL VASCULAR CATHETERIZATION Right 01/24/2016   Procedure: Lower Extremity Angiography;  Surgeon: Katha Cabal, MD;  Location: [facility];  Service: Cardiovascular;  Laterality: Right;    Social History Social History  Substance Use Topics  . Smoking status: Current Every Day Smoker    Packs/day: 1.00    Years: 62.00    Types: Cigarettes  . Smokeless tobacco: Never Used  . Alcohol use No    Family History Family History  Problem Relation Age of Onset  . Hypertension Mother   . Hyperlipidemia Mother   . Heart disease Mother   . Heart attack Mother   . Hyperlipidemia Father   . Hypertension Father   . Cancer Sister     No Known Allergies   REVIEW OF SYSTEMS (Negative unless checked)  Constitutional: [] Weight loss  [] Fever  [] Chills Cardiac: [] Chest pain   [] Chest pressure   [] Palpitations   [] Shortness of breath when laying flat   [] Shortness of breath with exertion. Vascular:  [x] Pain in legs with walking   [] Pain in legs at rest  [] History of DVT   [] Phlebitis   [x] Swelling in legs   []   Varicose veins   [] Non-healing ulcers Pulmonary:   [] Uses home oxygen   [] Productive cough   [] Hemoptysis   [] Wheeze  [] COPD   [] Asthma Neurologic:  [] Dizziness   [] Seizures   [] History of stroke   [] History of TIA  [] Aphasia   [] Vissual changes   [] Weakness or numbness in arm   [] Weakness or numbness in leg Musculoskeletal:   [] Joint swelling   [] Joint pain   [] Low back pain Hematologic:  [] Easy bruising  [] Easy bleeding   [] Hypercoagulable state   [] Anemic Gastrointestinal:  [] Diarrhea   [] Vomiting  [] Gastroesophageal reflux/heartburn    [] Difficulty swallowing. Genitourinary:  [] Chronic Rose disease   [] Difficult urination  [] Frequent urination   [] Blood in urine Skin:  [] Rashes   [] Ulcers  Psychological:  [] History of anxiety   []  History of major depression.  Physical Examination  Vitals:   03/11/17 1503  BP: 125/65  Pulse: 67  Resp: 16  Weight: 109 lb (49.4 kg)  Height: 4\' 11"  (1.499 m)   Body mass index is 22.02 kg/m. Gen: WD/WN, NAD Head: [place]/AT, No temporalis wasting.  Ear/Nose/Throat: Hearing grossly intact, nares w/o erythema or drainage Eyes: PER, EOMI, sclera nonicteric.  Neck: Supple, no large masses.   Pulmonary:  Good air movement, no audible wheezing bilaterally, no use of accessory muscles.  Cardiac: RRR, no JVD Vascular:  Vessel Right Left  Radial Palpable Palpable  Ulnar Palpable Palpable  Brachial Palpable Palpable  Carotid Palpable Palpable  Femoral Palpable Palpable  Popliteal Not Palpable Not Palpable  PT Not Palpable Not Palpable  DP Not Palpable Not Palpable  Gastrointestinal: Non-distended. No guarding/no peritoneal signs.  Musculoskeletal: M/S 5/5 throughout.  No deformity or atrophy.  Neurologic: CN 2-12 intact. Symmetrical.  Speech is fluent. Motor exam as listed above. Psychiatric: Judgment intact, Mood & affect appropriate for pt's clinical situation. Dermatologic: No rashes or ulcers noted.  No changes consistent with cellulitis. Lymph : No lichenification or skin changes of chronic lymphedema.  CBC No results found for: WBC, HGB, HCT, MCV, PLT  BMET    Component Value Date/Time   BUN 15 01/23/2016 0939   CREATININE 0.84 01/23/2016 0939   GFRNONAA >60 01/23/2016 0939   GFRAA >60 01/23/2016 0939   CrCl cannot be calculated (Patient's most recent lab result is older than the maximum 21 days allowed.).  COAG No results found for: INR, PROTIME  Radiology No results found.  Assessment/Plan 1. Atherosclerosis of native artery of both lower extremities with  intermittent claudication (HCC)  Recommend:  The patient has evidence of atherosclerosis of the lower extremities with claudication.  The patient does not voice lifestyle limiting changes at this point in time.  Noninvasive studies do not suggest clinically significant change.  No invasive studies, angiography or surgery at this time The patient should continue walking and begin a more formal exercise program.  The patient should continue antiplatelet therapy and aggressive treatment of the lipid abnormalities  No changes in the patient's medications at this time  The patient should continue wearing graduated compression socks 10-15 mmHg strength to control the mild edema.   - VAS [place] LOWER EXTREMITY ARTERIAL DUPLEX; Future - VAS [place] ABI WITH/WO TBI; Future  2. Neurogenic claudication due to lumbar spinal stenosis Continue to follow with the spine service  3. Pain in both lower extremities Combination of #1&2  4. Swelling of limb  No surgery or intervention at this point in time.    I have reviewed my previous discussion with the patient regarding  swelling and why it  causes symptoms.  The patient is doing well with compression and will continue wearing graduated compression stockings class 1 (20-30 mmHg) on a daily basis a prescription was given. The patient will  continue wearing the stockings first thing in the morning and removing them in the evening. The patient is instructed specifically not to sleep in the stockings.    In addition, behavioral modification including elevation during the day and exercise will be continued.    5. Essential hypertension Continue antihypertensive medications as already ordered, these medications have been reviewed and there are no changes at this time.   6. Mixed hyperlipidemia Continue statin as ordered and reviewed, no changes at this time     Hortencia Pilar, MD  03/16/2017 4:12 PM

## 2017-12-24 ENCOUNTER — Telehealth: Payer: Self-pay | Admitting: *Deleted

## 2017-12-24 ENCOUNTER — Other Ambulatory Visit: Payer: Self-pay | Admitting: Internal Medicine

## 2017-12-24 DIAGNOSIS — Z87891 Personal history of nicotine dependence: Secondary | ICD-10-CM

## 2017-12-24 DIAGNOSIS — R042 Hemoptysis: Secondary | ICD-10-CM

## 2017-12-24 DIAGNOSIS — Z122 Encounter for screening for malignant neoplasm of respiratory organs: Secondary | ICD-10-CM

## 2017-12-24 NOTE — Telephone Encounter (Signed)
Received referral for initial lung cancer screening scan. Contacted patient and obtained smoking history,(current, 48 pack year) as well as answering questions related to screening process. Patient denies signs of lung cancer such as weight loss. Patient is undergoing treatment for respiratory infection at this time but scheduling of lung screening will allow for resolution. Patient denies comorbidity that would prevent curative treatment if lung cancer were found. Patient is scheduled for shared decision making visit and CT scan on 01/24/18.

## 2017-12-25 ENCOUNTER — Ambulatory Visit
Admission: RE | Admit: 2017-12-25 | Discharge: 2017-12-25 | Disposition: A | Discharge status: Home / Self Care | Payer: Medicare Other | Source: Ambulatory Visit | Attending: Internal Medicine | Admitting: Internal Medicine

## 2017-12-25 DIAGNOSIS — R042 Hemoptysis: Secondary | ICD-10-CM | POA: Diagnosis not present

## 2017-12-25 DIAGNOSIS — I700 Atherosclerosis of aorta: Secondary | ICD-10-CM | POA: Insufficient documentation

## 2017-12-25 DIAGNOSIS — R911 Solitary pulmonary nodule: Secondary | ICD-10-CM | POA: Diagnosis not present

## 2017-12-25 DIAGNOSIS — J449 Chronic obstructive pulmonary disease, unspecified: Secondary | ICD-10-CM | POA: Diagnosis not present

## 2018-01-24 ENCOUNTER — Ambulatory Visit: Payer: Self-pay | Admitting: Oncology

## 2018-01-24 ENCOUNTER — Ambulatory Visit: Payer: Self-pay

## 2018-03-17 ENCOUNTER — Encounter (INDEPENDENT_AMBULATORY_CARE_PROVIDER_SITE_OTHER): Payer: Medicare Other

## 2018-03-17 ENCOUNTER — Ambulatory Visit (INDEPENDENT_AMBULATORY_CARE_PROVIDER_SITE_OTHER): Payer: Medicare Other | Admitting: Vascular Surgery

## 2018-04-10 ENCOUNTER — Encounter (INDEPENDENT_AMBULATORY_CARE_PROVIDER_SITE_OTHER): Payer: Self-pay | Admitting: Nurse Practitioner

## 2018-04-10 ENCOUNTER — Ambulatory Visit (INDEPENDENT_AMBULATORY_CARE_PROVIDER_SITE_OTHER): Payer: Medicare Other

## 2018-04-10 ENCOUNTER — Ambulatory Visit (INDEPENDENT_AMBULATORY_CARE_PROVIDER_SITE_OTHER): Payer: Medicare Other | Admitting: Nurse Practitioner

## 2018-04-10 VITALS — BP 131/63 | HR 73 | Resp 17 | Ht <= 58 in | Wt 101.0 lb

## 2018-04-10 DIAGNOSIS — E782 Mixed hyperlipidemia: Secondary | ICD-10-CM | POA: Diagnosis not present

## 2018-04-10 DIAGNOSIS — I10 Essential (primary) hypertension: Secondary | ICD-10-CM

## 2018-04-10 DIAGNOSIS — I70213 Atherosclerosis of native arteries of extremities with intermittent claudication, bilateral legs: Secondary | ICD-10-CM

## 2018-04-10 DIAGNOSIS — F17210 Nicotine dependence, cigarettes, uncomplicated: Secondary | ICD-10-CM | POA: Diagnosis not present

## 2018-04-16 ENCOUNTER — Encounter (INDEPENDENT_AMBULATORY_CARE_PROVIDER_SITE_OTHER): Payer: Self-pay | Admitting: Nurse Practitioner

## 2018-04-16 NOTE — Progress Notes (Signed)
Subjective:    Patient ID: Diane Rose, female    DOB: 06-01-1941, 77 y.o.   MRN: [id number] Chief Complaint  Patient presents with  . Follow-up    Follow up 1 year ABI and Arterial    HPI  Diane Rose is a 77 y.o. female that returns to the office for followup and review of the noninvasive studies. There has been a significant deterioration in the lower extremity symptoms.  The patient notes interval shortening of their claudication distance. No new ulcers or wounds have occurred since the last visit.  There have been no significant changes to the patient's overall health care.  Patient continues to smoke daily.  The patient denies amaurosis fugax or recent TIA symptoms. There are no recent neurological changes noted. The patient denies history of DVT, PE or superficial thrombophlebitis. The patient denies recent episodes of angina or shortness of breath.   ABI's Rt=0.68 and Lt=0.82 (previous ABI's Rt=0.87 and Lt=1.09) Patient also underwent a bilateral lower extremity arterial duplex.  The right lower extremity has triphasic waveforms down to the level of the SFA where it changes to biphasic waveforms.  There is a 30 to 49% stenosis in the distal SFA.  The distal popliteal artery has a 75 to 99% stenosis with monophasic flows through to the level of the tibial arteries.  The left lower extremity has triphasic waveforms to the level of the distal SFA.  The proximal popliteal artery has monophasic flows with triphasic flow in the anterior tibial arter  Past Medical History:  Diagnosis Date  . Cancer ([employer])    uterine 1971  . 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 Hypercholesteremia   . Hypertension   . Osteoporosis   . Peripheral vascular disease ([place])   . Spinal stenosis     Past Surgical History:  Procedure Laterality Date  . ABDOMINAL HYSTERECTOMY    . BREAST BIOPSY Left 1970   neg  . PERIPHERAL VASCULAR CATHETERIZATION N/A 03/09/2015   Procedure:  Abdominal Aortogram w/Lower Extremity;  Surgeon: Katha Cabal, MD;  Location: [facility];  Service: Cardiovascular;  Laterality: N/A;  . PERIPHERAL VASCULAR CATHETERIZATION  03/09/2015   Procedure: Lower Extremity Intervention;  Surgeon: Katha Cabal, MD;  Location: [facility];  Service: Cardiovascular;;  . PERIPHERAL VASCULAR CATHETERIZATION Right 01/24/2016   Procedure: Lower Extremity Angiography;  Surgeon: Katha Cabal, MD;  Location: [facility];  Service: Cardiovascular;  Laterality: Right;    Social History   Socioeconomic History  . Marital status: Married    Spouse name: Not on file  . Number of children: Not on file  . Years of education: Not on file  . Highest education level: Not on file  Occupational History  . Not on file  Social Needs  . Financial resource strain: Not on file  . Food insecurity:    Worry: Not on file    Inability: Not on file  . Transportation needs:    Medical: Not on file    Non-medical: Not on file  Tobacco Use  . Smoking status: Current Every Day Smoker    Packs/day: 1.00    Years: 62.00    Pack years: 62.00    Types: Cigarettes  . Smokeless tobacco: Never Used  Substance and Sexual Activity  . Alcohol use: No  . Drug use: No  . Sexual activity: Not on file  Lifestyle  . Physical activity:  Days per week: Not on file    Minutes per session: Not on file  . Stress: Not on file  Relationships  . Social connections:    Talks on phone: Not on file    Gets together: Not on file    Attends religious service: Not on file    Active member of club or organization: Not on file    Attends meetings of clubs or organizations: Not on file    Relationship status: Not on file  . Intimate partner violence:    Fear of current or ex partner: Not on file    Emotionally abused: Not on file    Physically abused: Not on file    Forced sexual activity: Not on file  Other Topics Concern  . Not on file    Social History Narrative  . Not on file    Family History  Problem Relation Age of Onset  . Hypertension Mother   . Hyperlipidemia Mother   . Heart disease Mother   . Heart attack Mother   . Hyperlipidemia Father   . Hypertension Father   . Cancer Sister     No Known Allergies   Review of Systems   Review of Systems: Negative Unless Checked Constitutional: [] Weight loss  [] Fever  [] Chills Cardiac: [] Chest pain   []  Atrial Fibrillation  [] Palpitations   [] Shortness of breath when laying flat   [] Shortness of breath with exertion. Vascular:  [x] Pain in legs with walking   [] Pain in legs with standing  [] History of DVT   [] Phlebitis   [] Swelling in legs   [] Varicose veins   [] Non-healing ulcers Pulmonary:   [] Uses home oxygen   [] Productive cough   [] Hemoptysis   [] Wheeze  [] COPD   [] Asthma Neurologic:  [] Dizziness   [] Seizures   [] History of stroke   [] History of TIA  [] Aphasia   [] Vissual changes   [] Weakness or numbness in arm   [x] Weakness or numbness in leg Musculoskeletal:   [] Joint swelling   [] Joint pain   [] Low back pain  []  History of Knee Replacement Hematologic:  [] Easy bruising  [] Easy bleeding   [] Hypercoagulable state   [] Anemic Gastrointestinal:  [] Diarrhea   [] Vomiting  [] Gastroesophageal reflux/heartburn   [] Difficulty swallowing. Genitourinary:  [] Chronic kidney disease   [] Difficult urination  [] Anuric   [] Blood in urine Skin:  [] Rashes   [] Ulcers  Psychological:  [] History of anxiety   []  History of major depression  []  Memory Difficulties     Objective:   Physical Exam  BP 131/63 (BP Location: Right Arm, Patient Position: Sitting)   Pulse 73   Resp 17   Ht 4\' 10"  (1.473 m)   Wt 101 lb (45.8 kg)   BMI 21.11 kg/m   Gen: WD/WN, NAD Head: [place]/AT, No temporalis wasting.  Ear/Nose/Throat: Hearing grossly intact, nares w/o erythema or drainage Eyes: PER, EOMI, sclera nonicteric.  Neck: Supple, no masses.  No JVD.  Pulmonary:  Good air movement, no use of  accessory muscles.  Cardiac: RRR Vascular:  Vessel Right Left  Radial Palpable Palpable  Dorsalis Pedis Not Palpable Not Palpable  Posterior Tibial Not Palpable Not Palpable   Gastrointestinal: soft, non-distended. No guarding/no peritoneal signs.  Musculoskeletal: M/S 5/5 throughout.  No deformity or atrophy.  Neurologic: Pain and light touch intact in extremities.  Symmetrical.  Speech is fluent. Motor exam as listed above. Psychiatric: Judgment intact, Mood & affect appropriate for pt's clinical situation. Dermatologic: No Venous rashes. No Ulcers Noted.  No changes  consistent with cellulitis. Lymph : No Cervical lymphadenopathy, no lichenification or skin changes of chronic lymphedema.      Assessment & Plan:   1. 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  Given the severity of the patient's lower extremity symptoms the patient should undergo angiography and intervention for her right lower extremity.  Risk and benefits were reviewed the patient.  Indications for the procedure were reviewed.  All questions were answered, the patient agrees to proceed.   The patient should continue walking and begin a more formal exercise program.  The patient should continue antiplatelet therapy and aggressive treatment of the lipid abnormalities  The patient was also advised that continued to smoke would also cause the need for repeat interventions.  Patient understands.  The patient has requested that her angiogram be scheduled after the new year.  The patient will follow up with me after the angiogram.   2. Essential hypertension Continue antihypertensive medications as already ordered, these medications have been reviewed and there are no changes at this time.   3. Mixed hyperlipidemia Continue statin as ordered and  reviewed, no changes at this time    Current Outpatient Medications on File Prior to Visit  Medication Sig Dispense Refill  . amLODipine (NORVASC) 10 MG tablet     . calcium-vitamin D (OSCAL WITH D) 500-200 MG-UNIT TABS tablet Take by mouth.    . clopidogrel (PLAVIX) 75 MG tablet TAKE ONE TABLET BY MOUTH ONCE DAILY 30 tablet 4  . Multiple Vitamin (MULTIVITAMIN) capsule Take 1 capsule by mouth daily.    Marland Kitchen PROAIR HFA 108 (90 Base) MCG/ACT inhaler     . simvastatin (ZOCOR) 20 MG tablet Take 20 mg by mouth daily.    Marland Kitchen aspirin 81 MG tablet Take 81 mg by mouth daily.    . fluticasone (FLONASE) 50 MCG/ACT nasal spray Place into the nose.    . gabapentin (NEURONTIN) 100 MG capsule Take 100 mg by mouth daily.    . methylphenidate (METADATE CD) 60 MG CR capsule Take 60 mg by mouth every morning.     No current facility-administered medications on file prior to visit.     There are no Patient Instructions on file for this visit. No follow-ups on file.   Kris Hartmann, NP  This note was completed with Sales executive.  Any errors are purely unintentional.

## 2018-04-24 ENCOUNTER — Encounter (INDEPENDENT_AMBULATORY_CARE_PROVIDER_SITE_OTHER): Payer: Self-pay

## 2018-05-08 ENCOUNTER — Encounter (INDEPENDENT_AMBULATORY_CARE_PROVIDER_SITE_OTHER): Payer: Self-pay

## 2018-06-09 ENCOUNTER — Encounter
Admission: RE | Admit: 2018-06-09 | Discharge: 2018-06-09 | Disposition: A | Discharge status: Home / Self Care | Payer: Medicare Other | Source: Ambulatory Visit | Attending: Vascular Surgery | Admitting: Vascular Surgery

## 2018-06-09 ENCOUNTER — Other Ambulatory Visit (INDEPENDENT_AMBULATORY_CARE_PROVIDER_SITE_OTHER): Payer: Self-pay | Admitting: Nurse Practitioner

## 2018-06-09 DIAGNOSIS — Z95820 Peripheral vascular angioplasty status with implants and grafts: Secondary | ICD-10-CM | POA: Diagnosis not present

## 2018-06-09 DIAGNOSIS — Z8249 Family history of ischemic heart disease and other diseases of the circulatory system: Secondary | ICD-10-CM | POA: Diagnosis not present

## 2018-06-09 DIAGNOSIS — Z90710 Acquired absence of both cervix and uterus: Secondary | ICD-10-CM | POA: Diagnosis not present

## 2018-06-09 DIAGNOSIS — M810 Age-related osteoporosis without current pathological fracture: Secondary | ICD-10-CM | POA: Diagnosis not present

## 2018-06-09 DIAGNOSIS — I10 Essential (primary) hypertension: Secondary | ICD-10-CM | POA: Diagnosis not present

## 2018-06-09 DIAGNOSIS — E782 Mixed hyperlipidemia: Secondary | ICD-10-CM | POA: Diagnosis not present

## 2018-06-09 DIAGNOSIS — F17210 Nicotine dependence, cigarettes, uncomplicated: Secondary | ICD-10-CM | POA: Diagnosis not present

## 2018-06-09 DIAGNOSIS — Z01812 Encounter for preprocedural laboratory examination: Secondary | ICD-10-CM | POA: Insufficient documentation

## 2018-06-09 DIAGNOSIS — I70213 Atherosclerosis of native arteries of extremities with intermittent claudication, bilateral legs: Secondary | ICD-10-CM | POA: Diagnosis not present

## 2018-06-09 LAB — BUN: BUN: 15 mg/dL (ref 8–23)

## 2018-06-09 LAB — CREATININE, SERUM
Creatinine, Ser: 0.73 mg/dL (ref 0.44–1.00)
GFR calc Af Amer: >60 mL/min (ref >60)
GFR calc non Af Amer: >60 mL/min (ref >60)

## 2018-06-10 ENCOUNTER — Ambulatory Visit
Admission: RE | Admit: 2018-06-10 | Discharge: 2018-06-10 | Disposition: A | Discharge status: Home / Self Care | Payer: Medicare Other | Attending: Vascular Surgery | Admitting: Vascular Surgery

## 2018-06-10 ENCOUNTER — Other Ambulatory Visit: Payer: Self-pay

## 2018-06-10 ENCOUNTER — Encounter
Admission: RE | Disposition: A | Discharge status: Home / Self Care | Payer: Self-pay | Source: Home / Self Care | Attending: Vascular Surgery

## 2018-06-10 DIAGNOSIS — E782 Mixed hyperlipidemia: Secondary | ICD-10-CM | POA: Insufficient documentation

## 2018-06-10 DIAGNOSIS — T82856A Stenosis of peripheral vascular stent, initial encounter: Secondary | ICD-10-CM | POA: Diagnosis not present

## 2018-06-10 DIAGNOSIS — M810 Age-related osteoporosis without current pathological fracture: Secondary | ICD-10-CM | POA: Insufficient documentation

## 2018-06-10 DIAGNOSIS — F17210 Nicotine dependence, cigarettes, uncomplicated: Secondary | ICD-10-CM | POA: Insufficient documentation

## 2018-06-10 DIAGNOSIS — I70219 Atherosclerosis of native arteries of extremities with intermittent claudication, unspecified extremity: Secondary | ICD-10-CM

## 2018-06-10 DIAGNOSIS — Z8249 Family history of ischemic heart disease and other diseases of the circulatory system: Secondary | ICD-10-CM | POA: Insufficient documentation

## 2018-06-10 DIAGNOSIS — Z90710 Acquired absence of both cervix and uterus: Secondary | ICD-10-CM | POA: Insufficient documentation

## 2018-06-10 DIAGNOSIS — I10 Essential (primary) hypertension: Secondary | ICD-10-CM | POA: Diagnosis not present

## 2018-06-10 DIAGNOSIS — I70213 Atherosclerosis of native arteries of extremities with intermittent claudication, bilateral legs: Secondary | ICD-10-CM | POA: Diagnosis not present

## 2018-06-10 DIAGNOSIS — Z95820 Peripheral vascular angioplasty status with implants and grafts: Secondary | ICD-10-CM | POA: Insufficient documentation

## 2018-06-10 HISTORY — PX: LOWER EXTREMITY ANGIOGRAPHY: CATH118251

## 2018-06-10 SURGERY — LOWER EXTREMITY ANGIOGRAPHY
Anesthesia: Moderate Sedation | Laterality: Right

## 2018-06-10 MED ORDER — OXYCODONE HCL 5 MG PO TABS: 5.0000 mg | ORAL_TABLET | ORAL | Status: DC | PRN

## 2018-06-10 MED ORDER — LABETALOL HCL 5 MG/ML IV SOLN: 10.0000 mg | INTRAVENOUS | Status: DC | PRN

## 2018-06-10 MED ORDER — CEFAZOLIN SODIUM-DEXTROSE 2-4 GM/100ML-% IV SOLN
INTRAVENOUS | Status: AC
  Administered 2018-06-10: 09:00:00
  Filled 2018-06-10: qty 100

## 2018-06-10 MED ORDER — SODIUM CHLORIDE 0.9 % IV SOLN: INTRAVENOUS | Status: DC

## 2018-06-10 MED ORDER — FENTANYL CITRATE (PF) 100 MCG/2ML IJ SOLN
INTRAMUSCULAR | Status: DC | PRN
  Administered 2018-06-10: 25 ug via INTRAVENOUS
  Administered 2018-06-10: 50 ug via INTRAVENOUS
  Administered 2018-06-10: 25 ug via INTRAVENOUS

## 2018-06-10 MED ORDER — SODIUM CHLORIDE 0.9 % IV SOLN: 250.0000 mL | INTRAVENOUS | Status: DC | PRN

## 2018-06-10 MED ORDER — ACETAMINOPHEN 325 MG PO TABS: 650.0000 mg | ORAL_TABLET | ORAL | Status: DC | PRN

## 2018-06-10 MED ORDER — MIDAZOLAM HCL 2 MG/2ML IJ SOLN
INTRAMUSCULAR | Status: DC | PRN
  Administered 2018-06-10: 0.5 mg via INTRAVENOUS
  Administered 2018-06-10: 2 mg via INTRAVENOUS
  Administered 2018-06-10: 0.5 mg via INTRAVENOUS

## 2018-06-10 MED ORDER — HYDROMORPHONE HCL 1 MG/ML IJ SOLN: 1.0000 mg | Freq: Once | INTRAMUSCULAR | Status: DC | PRN

## 2018-06-10 MED ORDER — ONDANSETRON HCL 4 MG/2ML IJ SOLN: 4.0000 mg | Freq: Four times a day (QID) | INTRAMUSCULAR | Status: DC | PRN

## 2018-06-10 MED ORDER — IOPAMIDOL (ISOVUE-300) INJECTION 61%
INTRAVENOUS | Status: DC | PRN
  Administered 2018-06-10: 85 mL via INTRA_ARTERIAL

## 2018-06-10 MED ORDER — MORPHINE SULFATE (PF) 4 MG/ML IV SOLN: 2.0000 mg | INTRAVENOUS | Status: DC | PRN

## 2018-06-10 MED ORDER — LIDOCAINE HCL (PF) 1 % IJ SOLN
INTRAMUSCULAR | Status: AC
  Filled 2018-06-10: qty 30

## 2018-06-10 MED ORDER — SODIUM CHLORIDE 0.9% FLUSH: 3.0000 mL | Freq: Two times a day (BID) | INTRAVENOUS | Status: DC

## 2018-06-10 MED ORDER — HEPARIN SODIUM (PORCINE) 1000 UNIT/ML IJ SOLN
INTRAMUSCULAR | Status: DC | PRN
  Administered 2018-06-10: 4000 [IU] via INTRAVENOUS

## 2018-06-10 MED ORDER — CEFAZOLIN SODIUM-DEXTROSE 2-4 GM/100ML-% IV SOLN: 2.0000 g | Freq: Once | INTRAVENOUS | Status: DC

## 2018-06-10 MED ORDER — ASPIRIN EC 81 MG PO TBEC: 81.0000 mg | DELAYED_RELEASE_TABLET | Freq: Every day | ORAL | 2 refills | Status: DC

## 2018-06-10 MED ORDER — HYDRALAZINE HCL 20 MG/ML IJ SOLN: 5.0000 mg | INTRAMUSCULAR | Status: DC | PRN

## 2018-06-10 MED ORDER — SODIUM CHLORIDE 0.9% FLUSH: 3.0000 mL | INTRAVENOUS | Status: DC | PRN

## 2018-06-10 MED ORDER — HEPARIN SODIUM (PORCINE) 1000 UNIT/ML IJ SOLN
INTRAMUSCULAR | Status: AC
  Filled 2018-06-10: qty 1

## 2018-06-10 MED ORDER — FENTANYL CITRATE (PF) 100 MCG/2ML IJ SOLN
INTRAMUSCULAR | Status: AC
  Filled 2018-06-10: qty 2

## 2018-06-10 MED ORDER — MIDAZOLAM HCL 5 MG/5ML IJ SOLN
INTRAMUSCULAR | Status: AC
  Filled 2018-06-10: qty 5

## 2018-06-10 MED ORDER — HEPARIN (PORCINE) IN NACL 1000-0.9 UT/500ML-% IV SOLN
INTRAVENOUS | Status: AC
  Filled 2018-06-10: qty 1000

## 2018-06-10 SURGICAL SUPPLY — 29 items
BALLN LUTONIX 4X150X130 (BALLOONS) ×3
BALLN LUTONIX 4X220X130 (BALLOONS) ×3
BALLN LUTONIX DCB 6X100X130 (BALLOONS) ×3
BALLN ULTRASCORE 4X100X130 (BALLOONS) ×3
BALLN ULTRASCORE 4X40X130 (BALLOONS) ×3
BALLOON LUTONIX 4X150X130 (BALLOONS) IMPLANT
BALLOON LUTONIX 4X220X130 (BALLOONS) IMPLANT
BALLOON LUTONIX DCB 6X100X130 (BALLOONS) IMPLANT
BALLOON ULTRASCORE 4X100X130 (BALLOONS) IMPLANT
BALLOON ULTRASCORE 4X40X130 (BALLOONS) IMPLANT
CATH PIG 70CM (CATHETERS) ×2 IMPLANT
CATH VERT 5FR 125CM (CATHETERS) ×2 IMPLANT
DEVICE PRESTO INFLATION (MISCELLANEOUS) ×2 IMPLANT
DEVICE STARCLOSE SE CLOSURE (Vascular Products) ×2 IMPLANT
DEVICE TORQUE .025-.038 (MISCELLANEOUS) ×2 IMPLANT
NDL ENTRY 21GA 7CM ECHOTIP (NEEDLE) IMPLANT
NEEDLE ENTRY 21GA 7CM ECHOTIP (NEEDLE) ×3 IMPLANT
PACK ANGIOGRAPHY (CUSTOM PROCEDURE TRAY) ×3 IMPLANT
SET INTRO CAPELLA COAXIAL (SET/KITS/TRAYS/PACK) ×4 IMPLANT
SHEATH BRITE TIP 5FRX11 (SHEATH) ×2 IMPLANT
SHEATH BRITE TIP 6FRX11 (SHEATH) ×2 IMPLANT
SHEATH RAABE 6FR (SHEATH) ×2 IMPLANT
SHIELD X-DRAPE GOLD 12X17 (MISCELLANEOUS) ×2 IMPLANT
SYR MEDRAD MARK V 150ML (SYRINGE) ×2 IMPLANT
TUBING CONTRAST HIGH PRESS 72 (TUBING) ×2 IMPLANT
WIRE AQUATRACK .035X260CM (WIRE) ×2 IMPLANT
WIRE G VAS 035X260 STIFF (WIRE) ×2 IMPLANT
WIRE J 3MM .035X145CM (WIRE) ×2 IMPLANT
WIRE MAGIC TORQUE 260C (WIRE) ×4 IMPLANT

## 2018-06-10 NOTE — H&P (Signed)
[facility] Admission History & Physical  MRN : [id number]  Diane Rose is a 78 y.o. ([birth year]) female who presents with chief complaint of No chief complaint on file. Marland Kitchen  History of Present Illness:   Patient presents to [facility] for treatment of her worsening right lower extremity occlusive disease.  She was last seen in early November in the office at which time physical examination as well as noninvasive studies demonstrated a significant worsening of her disease pattern.  The patient noted a significant deterioration in the lower extremity symptoms. The patient notes interval shortening of their claudication distance. No new ulcers or wounds have occurred since the last visit.  There have been no significant changes to the patient's overall health care.  Patient continues to smoke daily.  The patient denies amaurosis fugax or recent TIA symptoms. There are no recent neurological changes noted. The patient denies history of DVT, PE or superficial thrombophlebitis. The patient denies recent episodes of angina or shortness of breath.   ABI's Rt=0.68 and Lt=0.82 (previous ABI's Rt=0.87 and Lt=1.09)   Current Facility-Administered Medications  Medication Dose Route Frequency Provider Last Rate Last Dose  . ceFAZolin (ANCEF) 2-4 GM/100ML-% IVPB             Past Medical History:  Diagnosis Date  . Cancer ([place])    uterine 1971  . 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 Hypercholesteremia   . Hypertension   . Osteoporosis   . Peripheral vascular disease ([place])   . Spinal stenosis     Past Surgical History:  Procedure Laterality Date  . ABDOMINAL HYSTERECTOMY    . BREAST BIOPSY Left 1970   neg  . PERIPHERAL VASCULAR CATHETERIZATION N/A 03/09/2015   Procedure: Abdominal Aortogram w/Lower Extremity;  Surgeon: Katha Cabal, MD;  Location: [place] [facility];  Service: Cardiovascular;  Laterality:  N/A;  . PERIPHERAL VASCULAR CATHETERIZATION  03/09/2015   Procedure: Lower Extremity Intervention;  Surgeon: Katha Cabal, MD;  Location: [place] [facility];  Service: Cardiovascular;;  . PERIPHERAL VASCULAR CATHETERIZATION Right 01/24/2016   Procedure: Lower Extremity Angiography;  Surgeon: Katha Cabal, MD;  Location: [place] [facility];  Service: Cardiovascular;  Laterality: Right;    Social History Social History   Tobacco Use  . Smoking status: Current Every Day Smoker    Packs/day: 1.00    Years: 62.00    Pack years: 62.00    Types: Cigarettes  . Smokeless tobacco: Never Used  Substance Use Topics  . Alcohol use: No  . Drug use: No    Family History Family History  Problem Relation Age of Onset  . Hypertension Mother   . Hyperlipidemia Mother   . Heart disease Mother   . Heart attack Mother   . Hyperlipidemia Father   . Hypertension Father   . Cancer Sister   No family history of bleeding/clotting disorders, porphyria or autoimmune disease   No Known Allergies   REVIEW OF SYSTEMS (Negative unless checked)  Constitutional: [] Weight loss  [] Fever  [] Chills Cardiac: [] Chest pain   [] Chest pressure   [] Palpitations   [] Shortness of breath when laying flat   [] Shortness of breath at rest   [x] Shortness of breath with exertion. Vascular:  [x] Pain in legs with walking   [] Pain in legs at rest   [] Pain in legs when laying flat   [] Claudication   [] Pain in feet when walking  [] Pain in feet  at rest  [] Pain in feet when laying flat   [] History of DVT   [] Phlebitis   [] Swelling in legs   [] Varicose veins   [] Non-healing ulcers Pulmonary:   [] Uses home oxygen   [] Productive cough   [] Hemoptysis   [] Wheeze  [] COPD   [] Asthma Neurologic:  [] Dizziness  [] Blackouts   [] Seizures   [] History of stroke   [] History of TIA  [] Aphasia   [] Temporary blindness   [] Dysphagia   [] Weakness or numbness in arms   [] Weakness or numbness in legs Musculoskeletal:  [] Arthritis   [x] Joint  swelling   [x] Joint pain   [x] Low back pain Hematologic:  [] Easy bruising  [] Easy bleeding   [] Hypercoagulable state   [] Anemic  [] Hepatitis Gastrointestinal:  [] Blood in stool   [] Vomiting blood  [] Gastroesophageal reflux/heartburn   [] Difficulty swallowing. Genitourinary:  [] Chronic kidney disease   [] Difficult urination  [] Frequent urination  [] Burning with urination   [] Blood in urine Skin:  [] Rashes   [] Ulcers   [] Wounds Psychological:  [] History of anxiety   []  History of major depression.  Physical Examination  Vitals:   06/10/18 0713  BP: (!) 143/67  Pulse: 69  Resp: 18  Temp: 97.7 F (36.5 C)  TempSrc: Oral  SpO2: 99%  Weight: 46.7 kg  Height: 4\' 11"  (1.499 m)   Body mass index is 20.8 kg/m. Gen: WD/WN, NAD Head: [place]/AT, No temporalis wasting.  Ear/Nose/Throat: Hearing grossly intact, nares w/o erythema or drainage, oropharynx w/o Erythema/Exudate, Eyes: Sclera non-icteric, conjunctiva clear Neck: Supple, no nuchal rigidity.  No JVD.  Pulmonary:  Good air movement, no increased work of respiration or use of accessory muscles  Cardiac: RRR, normal S1, S2, no Murmurs, rubs or gallops. Vascular: Right foot is cool with sluggish capillary refill Vessel Right Left  Radial Palpable Palpable  PT  not palpable  not palpable  DP  not palpable  not palpable   Gastrointestinal: soft, non-tender/non-distended. No guarding/reflex. No masses, surgical incisions, or scars. Musculoskeletal: M/S 5/5 throughout.  No deformity or atrophy.  1-2+ edema Neurologic: Sensation grossly intact in extremities.  Symmetrical.  Speech is fluent. Motor exam as listed above. Psychiatric: Judgment intact, Mood & affect appropriate for pt's clinical situation. Dermatologic: No rashes or ulcers noted.  No cellulitis or open wounds. Lymph : No Cervical, Axillary, or Inguinal lymphadenopathy.      CBC No results found for: WBC, HGB, HCT, MCV, PLT  BMET    Component Value Date/Time   BUN 15  06/09/2018 1056   CREATININE 0.73 06/09/2018 1056   GFRNONAA >60 06/09/2018 1056   GFRAA >60 06/09/2018 1056   Estimated Creatinine Clearance: 40.2 mL/min (by C-G formula based on SCr of 0.73 mg/dL).  COAG No results found for: INR, PROTIME  Radiology No results found.    Assessment/Plan 1. 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  Given the severity of the patient's lower extremity symptoms the patient should undergo angiography and intervention for her right lower extremity.  Risk and benefits were reviewed the patient.  Indications for the procedure were reviewed.  All questions were answered, the patient agrees to proceed.   The patient should continue walking and begin a more formal exercise program.  The patient should continue antiplatelet therapy and aggressive treatment of the lipid abnormalities  The patient was also advised that continued to smoke would also cause the need for repeat interventions.  Patient understands.  The patient has requested that her angiogram be scheduled after the new year.  The patient will follow up with me after the angiogram.   2. Essential hypertension Continue antihypertensive medications as already ordered, these medications have been reviewed and there are no changes at this time.   3. Mixed hyperlipidemia Continue statin as ordered and reviewed, no changes at this time   Hortencia Pilar, MD  06/10/2018 8:43 AM

## 2018-06-10 NOTE — Op Note (Signed)
[facility] Percutaneous Study/Intervention Procedural Note   Date of Surgery: 06/10/2018  Surgeon:  Katha Cabal, MD.  Pre-operative Diagnosis: Atherosclerotic occlusive disease bilateral lower extremities with lifestyle limiting claudication  Post-operative diagnosis: Same  Procedure(s) Performed: 1. Introduction catheter into right lower extremity 3rd order catheter placement  2. Contrast injection right lower extremity for distal runoff   3. Percutaneous transluminal angioplasty right popliteal and superficial femoral artery to 4 mm with a Lutonix drug-eluting balloon.  4. Percutaneous transluminal angioplasty left external iliac artery to 6 mm with a Lutonix drug eluding balloon              5.  Star close closure left common femoral arteriotomy  Anesthesia: Conscious sedation was administered under my direct supervision by the interventional radiology RN. IV Versed plus fentanyl were utilized. Continuous ECG, pulse oximetry and blood pressure was monitored throughout the entire procedure.  Conscious sedation was for a total of 86 minutes.  Sheath: 6 [place] left common femoral retrograde  Contrast: 85 cc  Fluoroscopy Time: 11 minutes  Indications: Diane Rose presents with significant deterioration of her lower extremity symptoms and worsening of her physical exam as well as noninvasive parameters.  She is now describing lifestyle limiting claudication and is asking that she undergo treatment.  She is approximately 2 years status post crosser atherectomy and angioplasty of the left SFA and popliteal.  The risks and benefits are reviewed all questions answered patient agrees to proceed.  Procedure: Diane Rose is a 78 y.o. y.o. female who was identified and appropriate procedural time out was performed. The patient was then placed supine on the table and prepped and draped in the usual  sterile fashion.   Ultrasound was placed in the sterile sleeve and the left groin was evaluated the left common femoral artery was echolucent and pulsatile indicating patency.  Image was recorded for the permanent record and under real-time visualization a microneedle was inserted into the common femoral artery microwire followed by a micro-sheath.  A J-wire was then advanced through the micro-sheath and a  5 [place] sheath was then inserted over a J-wire. J-wire was then advanced and a 5 French pigtail catheter was positioned at the level of T12. AP projection of the aorta was then obtained. Pigtail catheter was repositioned to above the bifurcation and a RAO and LAO views of the pelvis were obtained.  Subsequently a pigtail catheter with the stiff angle Glidewire was used to cross the aortic bifurcation the catheter wire were advanced down into the right distal external iliac artery. Oblique view of the femoral bifurcation was then obtained and subsequently the wire was reintroduced and the pigtail catheter negotiated into the SFA representing third order catheter placement. Distal runoff was then performed.  5000 units of heparin was then given and allowed to circulate and a 6 [place] Rabi sheath was advanced up and over the bifurcation and positioned in the femoral artery  KMP  catheter and stiff angle Glidewire were then negotiated down into the distal popliteal.  Distal runoff was then completed by hand injection through the catheter. The wire was then reintroduced and negotiated into the peroneal and a 4 mm by 100 mm ultra score balloon as well as a 4 mm x 40 mm ultra score was used to angioplasty the superficial femoral and popliteal arteries at 3 locations. Inflations were to 12-14 atmospheres for 1 minute. Follow-up imaging demonstrated patency with less than 10% residual stenosis.  And therefore I  elected to treat these areas with a drug-eluting balloon and a 4 mm x 22 cm Lutonix drug-eluting balloon  followed by a 4 mm x 100 mm Lutonix drug-eluting balloon were used to treat these areas.  Both inflations were to 12 atm for 2 full minutes.  Follow-up imaging demonstrated less than 10% residual stenosis after the treatment with paclitaxel and distal runoff was then reassessed and found to be unchanged.  After review of these images the sheath is pulled into the distal left external iliac artery and a steep RAO projection is obtained.  This demonstrates greater than 60% stenosis at the origin of the external iliac on the left as well as a greater than 90% stenosis at the distal end of the stent.  There is diffuse in-stent restenosis noted as well.  The previously placed stent was a 6 mm x 58 life stream and therefore I elected to use a 6 mm x 100 mm Lutonix drug-eluting balloon.  This covered the entire length of the stent as well as the 90% distal stenosis as well as the stenosis of the origin of the external iliac.  Inflation was to 12 atm for 2 full minutes.  Follow-up imaging by hand-injection demonstrated less than 5% residual stenosis.  Subsequently, oblique of the common femoral is obtained and a Star close device deployed. There no immediate complications.   Findings: The abdominal aorta is opacified with a bolus injection contrast. Renal arteries are single and patent. The aorta itself has diffuse disease but no hemodynamically significant lesions. The common iliac arteries are widely patent bilaterally.  The right external iliac artery is diffusely diseased but there are no hemodynamically significant lesions noted.  The left external iliac artery demonstrates a previously placed stent.  Review of the history shows that this is a 6 mm x 58 mm lifestream stent.  Proximally there is a greater than 60% stenosis of the external iliac artery just before the stent and distally right at the distal margin of the lifestream stent there is a greater than 90% stenosis.  The right common femoral is widely  patent as is the profunda femoris.  The SFA and popliteal does indeed have multiple significant stenosis beginning in the midportion and extending down to the distal popliteal just above the origin of the anterior tibial.  In the below-knee popliteal there is a greater than 90% stenosis with a string sign and a greater than 80% stenosis just distal to that.  In the mid popliteal there is a greater than 80% stenosis and in the mid SFA and at Hunter's canal there are greater than 70% stenoses noted.  The distal popliteal is patent and the trifurcation is patent with the anterior tibial and posterior tibial arteries as the dominant runoff to the foot filling both the dorsalis pedis and lateral plantar arteries respectively.  Peroneal demonstrates patency but it is small in caliber.  Following angioplasty to 4 mm with drug-eluting balloon the SFA and popliteal is now patent with in-line flow and looks quite nice and less than 10% residual stenosis.  There is preservation of the three-vessel runoff to the foot.  Following angioplasty to 6 mm the left external iliac artery is now widely patent with less than 5% residual stenosis.    Summary: Successful recanalization bilateral lower extremity for treatment of lifestyle limiting claudication    Disposition: Patient was taken to the recovery room in stable condition having tolerated the procedure well.  Vickye Astorino, [name] 06/10/2018,10:28 AM

## 2018-06-10 NOTE — Progress Notes (Signed)
Pt. And 2 daughters waiting to speak with Dr. Delana Meyer before DC home.

## 2018-06-24 ENCOUNTER — Other Ambulatory Visit: Payer: Self-pay | Admitting: Internal Medicine

## 2018-06-24 DIAGNOSIS — Z1231 Encounter for screening mammogram for malignant neoplasm of breast: Secondary | ICD-10-CM

## 2018-07-02 ENCOUNTER — Other Ambulatory Visit (INDEPENDENT_AMBULATORY_CARE_PROVIDER_SITE_OTHER): Payer: Self-pay | Admitting: Vascular Surgery

## 2018-07-02 DIAGNOSIS — Z09 Encounter for follow-up examination after completed treatment for conditions other than malignant neoplasm: Secondary | ICD-10-CM

## 2018-07-03 ENCOUNTER — Ambulatory Visit (INDEPENDENT_AMBULATORY_CARE_PROVIDER_SITE_OTHER): Payer: Medicare Other | Admitting: Vascular Surgery

## 2018-07-03 ENCOUNTER — Encounter (INDEPENDENT_AMBULATORY_CARE_PROVIDER_SITE_OTHER): Payer: Self-pay | Admitting: Vascular Surgery

## 2018-07-03 ENCOUNTER — Ambulatory Visit (INDEPENDENT_AMBULATORY_CARE_PROVIDER_SITE_OTHER): Payer: Medicare Other

## 2018-07-03 VITALS — BP 139/62 | HR 74 | Resp 16 | Ht 59.0 in | Wt 103.4 lb

## 2018-07-03 DIAGNOSIS — I10 Essential (primary) hypertension: Secondary | ICD-10-CM

## 2018-07-03 DIAGNOSIS — E782 Mixed hyperlipidemia: Secondary | ICD-10-CM | POA: Diagnosis not present

## 2018-07-03 DIAGNOSIS — Z9862 Peripheral vascular angioplasty status: Secondary | ICD-10-CM | POA: Diagnosis not present

## 2018-07-03 DIAGNOSIS — I739 Peripheral vascular disease, unspecified: Secondary | ICD-10-CM | POA: Diagnosis not present

## 2018-07-03 DIAGNOSIS — I70213 Atherosclerosis of native arteries of extremities with intermittent claudication, bilateral legs: Secondary | ICD-10-CM | POA: Diagnosis not present

## 2018-07-03 DIAGNOSIS — Z09 Encounter for follow-up examination after completed treatment for conditions other than malignant neoplasm: Secondary | ICD-10-CM

## 2018-07-03 DIAGNOSIS — F17210 Nicotine dependence, cigarettes, uncomplicated: Secondary | ICD-10-CM

## 2018-07-03 NOTE — Progress Notes (Signed)
MRN : [id number]  Diane Rose is a 78 y.o. (July 15, 1940) female who presents with chief complaint of  Chief Complaint  Patient presents with  . Follow-up  .  History of Present Illness:   The patient returns to the office for followup and review status post angiogram with intervention on 06/10/2018.   Procedure(s) Performed: 1. Introduction catheter into right lower extremity 3rd order catheter placement  2. Contrast injection right lower extremity for distal runoff   3. Percutaneous transluminal angioplasty right popliteal and superficial femoral artery to 4 mm with a Lutonix drug-eluting balloon.  4. Percutaneous transluminal angioplasty left external iliac artery to 6 mm with a Lutonix drug eluding balloon              5.  Star close closure left common femoral arteriotomy  The patient notes improvement in the lower extremity symptoms. No interval shortening of the patient's claudication distance or rest pain symptoms. Previous wounds have now healed.  No new ulcers or wounds have occurred since the last visit.  There have been no significant changes to the patient's overall health care.  The patient denies amaurosis fugax or recent TIA symptoms. There are no recent neurological changes noted. The patient denies history of DVT, PE or superficial thrombophlebitis. The patient denies recent episodes of angina or shortness of breath.   ABI's Rt=0.92 and Lt=0.95(previous ABI's Rt=0.68 and Lt=0.82 )    Current Meds  Medication Sig  . amLODipine (NORVASC) 10 MG tablet   . aspirin EC 81 MG tablet Take 1 tablet (81 mg total) by mouth daily.  . calcium-vitamin D (OSCAL WITH D) 500-200 MG-UNIT TABS tablet Take 1 tablet by mouth at bedtime.   . clopidogrel (PLAVIX) 75 MG tablet TAKE ONE TABLET BY MOUTH ONCE DAILY  . fluticasone (FLONASE) 50 MCG/ACT nasal spray Place into the nose daily as needed.   . Multiple Vitamin (MULTIVITAMIN)  capsule Take 1 capsule by mouth daily.  Marland Kitchen PROAIR HFA 108 (90 Base) MCG/ACT inhaler Inhale 2 puffs into the lungs every 6 (six) hours as needed for wheezing.   . simvastatin (ZOCOR) 20 MG tablet Take 20 mg by mouth daily.    Past Medical History:  Diagnosis Date  . Cancer ([place])    uterine 1971  . 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 Hypercholesteremia   . Hypertension   . Osteoporosis   . Peripheral vascular disease ([place])   . Spinal stenosis     Past Surgical History:  Procedure Laterality Date  . ABDOMINAL HYSTERECTOMY    . BREAST BIOPSY Left 1970   neg  . LOWER EXTREMITY ANGIOGRAPHY Right 06/10/2018   Procedure: LOWER EXTREMITY ANGIOGRAPHY;  Surgeon: Katha Cabal, MD;  Location: [place] [facility];  Service: Cardiovascular;  Laterality: Right;  . PERIPHERAL VASCULAR CATHETERIZATION N/A 03/09/2015   Procedure: Abdominal Aortogram w/Lower Extremity;  Surgeon: Katha Cabal, MD;  Location: [place] [facility];  Service: Cardiovascular;  Laterality: N/A;  . PERIPHERAL VASCULAR CATHETERIZATION  03/09/2015   Procedure: Lower Extremity Intervention;  Surgeon: Katha Cabal, MD;  Location: [place] [facility];  Service: Cardiovascular;;  . PERIPHERAL VASCULAR CATHETERIZATION Right 01/24/2016   Procedure: Lower Extremity Angiography;  Surgeon: Katha Cabal, MD;  Location: [place] [facility];  Service: Cardiovascular;  Laterality: Right;    Social History Social History   Tobacco Use  . Smoking status: Current Every Day Smoker    Packs/day: 1.00  Years: 62.00    Pack years: 62.00    Types: Cigarettes  . Smokeless tobacco: Never Used  Substance Use Topics  . Alcohol use: No  . Drug use: No    Family History Family History  Problem Relation Age of Onset  . Hypertension Mother   . Hyperlipidemia Mother   . Heart disease Mother   . Heart attack Mother   . Hyperlipidemia Father   . Hypertension Father   . Cancer Sister     No  Known Allergies   REVIEW OF SYSTEMS (Negative unless checked)  Constitutional: [] Weight loss  [] Fever  [] Chills Cardiac: [] Chest pain   [] Chest pressure   [] Palpitations   [] Shortness of breath when laying flat   [] Shortness of breath with exertion. Vascular:  [x] Pain in legs with walking   [] Pain in legs at rest  [] History of DVT   [] Phlebitis   [] Swelling in legs   [] Varicose veins   [] Non-healing ulcers Pulmonary:   [] Uses home oxygen   [] Productive cough   [] Hemoptysis   [] Wheeze  [] COPD   [] Asthma Neurologic:  [] Dizziness   [] Seizures   [] History of stroke   [] History of TIA  [] Aphasia   [] Vissual changes   [] Weakness or numbness in arm   [] Weakness or numbness in leg Musculoskeletal:   [] Joint swelling   [] Joint pain   [x] Low back pain Hematologic:  [] Easy bruising  [] Easy bleeding   [] Hypercoagulable state   [] Anemic Gastrointestinal:  [] Diarrhea   [] Vomiting  [] Gastroesophageal reflux/heartburn   [] Difficulty swallowing. Genitourinary:  [] Chronic kidney disease   [] Difficult urination  [] Frequent urination   [] Blood in urine Skin:  [] Rashes   [] Ulcers  Psychological:  [] History of anxiety   []  History of major depression.  Physical Examination  Vitals:   07/03/18 1349  BP: 139/62  Pulse: 74  Resp: 16  Weight: 103 lb 6.4 oz (46.9 kg)  Height: 4\' 11"  (1.499 m)   Body mass index is 20.88 kg/m. Gen: WD/WN, NAD Head: [place]/AT, No temporalis wasting.  Ear/Nose/Throat: Hearing grossly intact, nares w/o erythema or drainage Eyes: PER, EOMI, sclera nonicteric.  Neck: Supple, no large masses.   Pulmonary:  Good air movement, no audible wheezing bilaterally, no use of accessory muscles.  Cardiac: RRR, no JVD Vascular:  Vessel Right Left  Radial Palpable Palpable  PT Palpable Trace Palpable  DP Trace Palpable Trace Palpable  Gastrointestinal: Non-distended. No guarding/no peritoneal signs.  Musculoskeletal: M/S 5/5 throughout.  No deformity or atrophy.  Neurologic: CN 2-12 intact.  Symmetrical.  Speech is fluent. Motor exam as listed above. Psychiatric: Judgment intact, Mood & affect appropriate for pt's clinical situation. Dermatologic: No rashes or ulcers noted.  No changes consistent with cellulitis. Lymph : No lichenification or skin changes of chronic lymphedema.  CBC No results found for: WBC, HGB, HCT, MCV, PLT  BMET    Component Value Date/Time   BUN 15 06/09/2018 1056   CREATININE 0.73 06/09/2018 1056   GFRNONAA >60 06/09/2018 1056   GFRAA >60 06/09/2018 1056   CrCl cannot be calculated (Patient's most recent lab result is older than the maximum 21 days allowed.).  COAG No results found for: INR, PROTIME  Radiology No results found.   Assessment/Plan 1. Atherosclerosis of native artery of both lower extremities with intermittent claudication (HCC) Recommend:  The patient is status post successful angiogram with intervention.  The patient reports that the claudication symptoms and leg pain is essentially gone.   The patient denies lifestyle limiting changes at  this point in time.  No further invasive studies, angiography or surgery at this time The patient should continue walking and begin a more formal exercise program.  The patient should continue antiplatelet therapy and aggressive treatment of the lipid abnormalities  Smoking cessation was again discussed  The patient should continue wearing graduated compression socks 10-15 mmHg strength to control the mild edema.  Patient should undergo noninvasive studies as ordered. The patient will follow up with me after the studies.   - VAS [place] ABI WITH/WO TBI; Future - VAS [place] LOWER EXTREMITY ARTERIAL DUPLEX; Future  2. Essential hypertension Continue antihypertensive medications as already ordered, these medications have been reviewed and there are no changes at this time.   3. Mixed hyperlipidemia Continue statin as ordered and reviewed, no changes at this time     Hortencia Pilar,  MD  07/03/2018 2:19 PM

## 2018-07-06 ENCOUNTER — Encounter (INDEPENDENT_AMBULATORY_CARE_PROVIDER_SITE_OTHER): Payer: Self-pay | Admitting: Vascular Surgery

## 2018-07-08 ENCOUNTER — Ambulatory Visit
Admission: RE | Admit: 2018-07-08 | Discharge: 2018-07-08 | Disposition: A | Discharge status: Home / Self Care | Payer: Medicare Other | Source: Ambulatory Visit | Attending: Internal Medicine | Admitting: Internal Medicine

## 2018-07-08 DIAGNOSIS — Z1231 Encounter for screening mammogram for malignant neoplasm of breast: Secondary | ICD-10-CM | POA: Insufficient documentation

## 2018-07-09 ENCOUNTER — Other Ambulatory Visit: Payer: Self-pay | Admitting: Internal Medicine

## 2018-07-09 DIAGNOSIS — R928 Other abnormal and inconclusive findings on diagnostic imaging of breast: Secondary | ICD-10-CM

## 2018-07-22 ENCOUNTER — Ambulatory Visit
Admission: RE | Admit: 2018-07-22 | Discharge: 2018-07-22 | Disposition: A | Discharge status: Home / Self Care | Payer: Medicare Other | Source: Ambulatory Visit | Attending: Internal Medicine | Admitting: Internal Medicine

## 2018-07-22 DIAGNOSIS — R928 Other abnormal and inconclusive findings on diagnostic imaging of breast: Secondary | ICD-10-CM

## 2018-10-02 ENCOUNTER — Other Ambulatory Visit: Payer: Self-pay

## 2018-10-02 ENCOUNTER — Ambulatory Visit (INDEPENDENT_AMBULATORY_CARE_PROVIDER_SITE_OTHER): Payer: Medicare Other

## 2018-10-02 ENCOUNTER — Ambulatory Visit (INDEPENDENT_AMBULATORY_CARE_PROVIDER_SITE_OTHER): Payer: Medicare Other | Admitting: Nurse Practitioner

## 2018-10-02 ENCOUNTER — Encounter (INDEPENDENT_AMBULATORY_CARE_PROVIDER_SITE_OTHER): Payer: Self-pay | Admitting: Nurse Practitioner

## 2018-10-02 VITALS — BP 153/67 | HR 72 | Resp 10 | Ht <= 58 in | Wt 104.0 lb

## 2018-10-02 DIAGNOSIS — Z9862 Peripheral vascular angioplasty status: Secondary | ICD-10-CM

## 2018-10-02 DIAGNOSIS — I70213 Atherosclerosis of native arteries of extremities with intermittent claudication, bilateral legs: Secondary | ICD-10-CM

## 2018-10-02 DIAGNOSIS — Z79899 Other long term (current) drug therapy: Secondary | ICD-10-CM

## 2018-10-02 DIAGNOSIS — E782 Mixed hyperlipidemia: Secondary | ICD-10-CM | POA: Diagnosis not present

## 2018-10-02 DIAGNOSIS — Z7902 Long term (current) use of antithrombotics/antiplatelets: Secondary | ICD-10-CM

## 2018-10-02 DIAGNOSIS — I739 Peripheral vascular disease, unspecified: Secondary | ICD-10-CM

## 2018-10-02 DIAGNOSIS — I10 Essential (primary) hypertension: Secondary | ICD-10-CM

## 2018-10-02 DIAGNOSIS — F17210 Nicotine dependence, cigarettes, uncomplicated: Secondary | ICD-10-CM | POA: Diagnosis not present

## 2018-10-02 NOTE — Progress Notes (Signed)
SUBJECTIVE:  Patient ID: Diane Rose, female    DOB: Aug 24, 1940, 78 y.o.   MRN: [id number] Chief Complaint  Patient presents with  . Follow-up    HPI  Diane Rose is a 78 y.o. female The patient returns to the office for followup and review of the noninvasive studies. There have been no interval changes in lower extremity symptoms. No interval shortening of the patient's claudication distance or development of rest pain symptoms. No new ulcers or wounds have occurred since the last visit.  There have been no significant changes to the patient's overall health care.  The patient denies amaurosis fugax or recent TIA symptoms. There are no recent neurological changes noted. The patient denies history of DVT, PE or superficial thrombophlebitis. The patient denies recent episodes of angina or shortness of breath.   ABI Rt=1.01 and Lt=0.94  (previous ABI's Rt=0.93 and Lt=0.92) Duplex ultrasound of the right lower extremity reveals triphasic waveforms down to the level of the tibial arteries.  There is a noted 50 to 74% stenosis within the right superficial femoral artery.  The right distal popliteal artery velocity has decreased since the previous exam on 07/03/2018.  The bilateral tibial arteries have triphasic waveforms.  Past Medical History:  Diagnosis Date  . Cancer ([place])    uterine 1971  . 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 Hypercholesteremia   . Hypertension   . Osteoporosis   . Peripheral vascular disease ([place])   . Spinal stenosis     Past Surgical History:  Procedure Laterality Date  . ABDOMINAL HYSTERECTOMY    . BREAST BIOPSY Left 1970   neg  . LOWER EXTREMITY ANGIOGRAPHY Right 06/10/2018   Procedure: LOWER EXTREMITY ANGIOGRAPHY;  Surgeon: Katha Cabal, MD;  Location: [facility];  Service: Cardiovascular;  Laterality: Right;  . OOPHORECTOMY    . PERIPHERAL VASCULAR CATHETERIZATION N/A 03/09/2015   Procedure: Abdominal Aortogram  w/Lower Extremity;  Surgeon: Katha Cabal, MD;  Location: [facility];  Service: Cardiovascular;  Laterality: N/A;  . PERIPHERAL VASCULAR CATHETERIZATION  03/09/2015   Procedure: Lower Extremity Intervention;  Surgeon: Katha Cabal, MD;  Location: [facility];  Service: Cardiovascular;;  . PERIPHERAL VASCULAR CATHETERIZATION Right 01/24/2016   Procedure: Lower Extremity Angiography;  Surgeon: Katha Cabal, MD;  Location: [facility];  Service: Cardiovascular;  Laterality: Right;    Social History   Socioeconomic History  . Marital status: Widowed    Spouse name: Not on file  . Number of children: 2  . Years of education: Not on file  . Highest education level: Not on file  Occupational History  . Occupation: Retired     Comment: [employer]  Social Needs  . Financial resource strain: Not very hard  . Food insecurity:    Worry: Never true    Inability: Never true  . Transportation needs:    Medical: No    Non-medical: No  Tobacco Use  . Smoking status: Current Every Day Smoker    Packs/day: 1.00    Years: 62.00    Pack years: 62.00    Types: Cigarettes  . Smokeless tobacco: Never Used  Substance and Sexual Activity  . Alcohol use: No  . Drug use: No  . Sexual activity: Not on file  Lifestyle  . Physical activity:    Days per week: 2 days    Minutes per session: Not on file  . Stress: Not on  file  Relationships  . Social connections:    Talks on phone: More than three times a week    Gets together: Never    Attends religious service: More than 4 times per year    Active member of club or organization: Not on file    Attends meetings of clubs or organizations: Not on file    Relationship status: Widowed  . Intimate partner violence:    Fear of current or ex partner: No    Emotionally abused: No    Physically abused: No    Forced sexual activity: No  Other Topics Concern  . Not on file  Social History Narrative  . Not on  file    Family History  Problem Relation Age of Onset  . Hypertension Mother   . Hyperlipidemia Mother   . Heart disease Mother   . Heart attack Mother   . Hyperlipidemia Father   . Hypertension Father   . Cancer Sister     No Known Allergies   Review of Systems   Review of Systems: Negative Unless Checked Constitutional: [] Weight loss  [] Fever  [] Chills Cardiac: [] Chest pain   []  Atrial Fibrillation  [] Palpitations   [] Shortness of breath when laying flat   [] Shortness of breath with exertion. [] Shortness of breath at rest Vascular:  [] Pain in legs with walking   [] Pain in legs with standing [] Pain in legs when laying flat   [x] Claudication    [] Pain in feet when laying flat    [] History of DVT   [] Phlebitis   [] Swelling in legs   [] Varicose veins   [] Non-healing ulcers Pulmonary:   [] Uses home oxygen   [] Productive cough   [] Hemoptysis   [] Wheeze  [] COPD   [] Asthma Neurologic:  [] Dizziness   [] Seizures  [] Blackouts [] History of stroke   [] History of TIA  [] Aphasia   [] Temporary Blindness   [] Weakness or numbness in arm   [] Weakness or numbness in leg Musculoskeletal:   [] Joint swelling   [] Joint pain   [x] Low back pain  []  History of Knee Replacement [x] Arthritis [] back Surgeries  [x]  Spinal Stenosis    Hematologic:  [] Easy bruising  [] Easy bleeding   [] Hypercoagulable state   [] Anemic Gastrointestinal:  [] Diarrhea   [] Vomiting  [] Gastroesophageal reflux/heartburn   [] Difficulty swallowing. [] Abdominal pain Genitourinary:  [] Chronic kidney disease   [] Difficult urination  [] Anuric   [] Blood in urine [] Frequent urination  [] Burning with urination   [] Hematuria Skin:  [] Rashes   [] Ulcers [] Wounds Psychological:  [] History of anxiety   []  History of major depression  []  Memory Difficulties      OBJECTIVE:   Physical Exam  BP (!) 153/67 (BP Location: Left Arm, Patient Position: Sitting, Cuff Size: Small)   Pulse 72   Resp 10   Ht 4\' 10"  (1.473 m)   Wt 104 lb (47.2 kg)   BMI  21.74 kg/m   Gen: WD/WN, NAD Head: [place]/AT, No temporalis wasting.  Ear/Nose/Throat: Hearing grossly intact, nares w/o erythema or drainage Eyes: PER, EOMI, sclera nonicteric.  Neck: Supple, no masses.  No JVD.  Pulmonary:  Good air movement, no use of accessory muscles.  Cardiac: RRR Vascular:  Vessel Right Left  Radial Palpable Palpable  Dorsalis Pedis Palpable Palpable  Posterior Tibial Palpable Palpable   Gastrointestinal: soft, non-distended. No guarding/no peritoneal signs.  Musculoskeletal: M/S 5/5 throughout.  No deformity or atrophy.  Neurologic: Pain and light touch intact in extremities.  Symmetrical.  Speech is fluent. Motor exam as listed above. Psychiatric:  Judgment intact, Mood & affect appropriate for pt's clinical situation. Dermatologic: No Venous rashes. No Ulcers Noted.  No changes consistent with cellulitis. Lymph : No Cervical lymphadenopathy, no lichenification or skin changes of chronic lymphedema.       ASSESSMENT AND PLAN:  1. PAD (peripheral artery disease) (HCC)  Recommend:  The patient has evidence of atherosclerosis of the lower extremities with minimal claudication.  The patient does not voice lifestyle limiting changes at this point in time.  Noninvasive studies do not suggest clinically significant change.  No invasive studies, angiography or surgery at this time The patient should continue walking and begin a more formal exercise program.  The patient should continue antiplatelet therapy and aggressive treatment of the lipid abnormalities  No changes in the patient's medications at this time  The patient should continue wearing graduated compression socks 10-15 mmHg strength to control the mild edema.   The patient will follow up in six months  - VAS [place] ABI WITH/WO TBI; Future - VAS [place] LOWER EXTREMITY ARTERIAL DUPLEX; Future  2. Essential hypertension Continue antihypertensive medications as already ordered, these medications have been  reviewed and there are no changes at this time.   3. Mixed hyperlipidemia Continue statin as ordered and reviewed, no changes at this time    Current Outpatient Medications on File Prior to Visit  Medication Sig Dispense Refill  . amLODipine (NORVASC) 10 MG tablet     . aspirin EC 81 MG tablet Take 1 tablet (81 mg total) by mouth daily. 150 tablet 2  . calcium-vitamin D (OSCAL WITH D) 500-200 MG-UNIT TABS tablet Take 1 tablet by mouth at bedtime.     . clopidogrel (PLAVIX) 75 MG tablet TAKE ONE TABLET BY MOUTH ONCE DAILY 30 tablet 4  . Multiple Vitamin (MULTIVITAMIN) capsule Take 1 capsule by mouth daily.    . simvastatin (ZOCOR) 20 MG tablet Take 20 mg by mouth daily.    . fluticasone (FLONASE) 50 MCG/ACT nasal spray Place into the nose daily as needed.      No current facility-administered medications on file prior to visit.     There are no Patient Instructions on file for this visit. No follow-ups on file.   Kris Hartmann, NP  This note was completed with Sales executive.  Any errors are purely unintentional.

## 2019-04-05 NOTE — Progress Notes (Deleted)
MRN : [id number]  Diane Rose is a 78 y.o. ([birth year]) female who presents with chief complaint of No chief complaint on file. Marland Kitchen  History of Present Illness:   The patient returns to the office for followup and review status post angiogram with intervention on 06/10/2018.   Procedure(s) Performed: 1. Introduction catheter intorightlower extremity 3rd order catheter placement  2.Contrast injectionrightlower extremity for distal runoff  3. Percutaneous transluminal angioplastyright popliteal andsuperficial femoral artery to4 mm with a Lutonix drug-eluting balloon. 4. Percutaneous transluminal angioplasty left external iliac artery to 6 mm with a Lutonix drug eluding balloon 5.Star close closureleftcommon femoral arteriotomy  The patient notes improvement in the lower extremity symptoms. No interval shortening of the patient's claudication distance or rest pain symptoms. Previous wounds have now healed.  No new ulcers or wounds have occurred since the last visit.  There have been no significant changes to the patient's overall health care.  The patient denies amaurosis fugax or recent TIA symptoms. There are no recent neurological changes noted. The patient denies history of DVT, PE or superficial thrombophlebitis. The patient denies recent episodes of angina or shortness of breath.   ABI's today Rt=*** and Lt=*** (previous ABI Rt=*** and Lt=***)  No outpatient medications have been marked as taking for the 04/06/19 encounter (Appointment) with Delana Meyer, [name], MD.    Past Medical History:  Diagnosis Date  . Cancer ([place])    uterine 1971  . 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 Hypercholesteremia   . Hypertension   . Osteoporosis   . Peripheral vascular disease ([place])   . Spinal stenosis     Past Surgical History:  Procedure Laterality Date  . ABDOMINAL HYSTERECTOMY     . BREAST BIOPSY Left 1970   neg  . LOWER EXTREMITY ANGIOGRAPHY Right 06/10/2018   Procedure: LOWER EXTREMITY ANGIOGRAPHY;  Surgeon: Katha Cabal, MD;  Location: [facility];  Service: Cardiovascular;  Laterality: Right;  . OOPHORECTOMY    . PERIPHERAL VASCULAR CATHETERIZATION N/A 03/09/2015   Procedure: Abdominal Aortogram w/Lower Extremity;  Surgeon: Katha Cabal, MD;  Location: [facility];  Service: Cardiovascular;  Laterality: N/A;  . PERIPHERAL VASCULAR CATHETERIZATION  03/09/2015   Procedure: Lower Extremity Intervention;  Surgeon: Katha Cabal, MD;  Location: [facility];  Service: Cardiovascular;;  . PERIPHERAL VASCULAR CATHETERIZATION Right 01/24/2016   Procedure: Lower Extremity Angiography;  Surgeon: Katha Cabal, MD;  Location: [facility];  Service: Cardiovascular;  Laterality: Right;    Social History Social History   Tobacco Use  . Smoking status: Current Every Day Smoker    Packs/day: 1.00    Years: 62.00    Pack years: 62.00    Types: Cigarettes  . Smokeless tobacco: Never Used  Substance Use Topics  . Alcohol use: No  . Drug use: No    Family History Family History  Problem Relation Age of Onset  . Hypertension Mother   . Hyperlipidemia Mother   . Heart disease Mother   . Heart attack Mother   . Hyperlipidemia Father   . Hypertension Father   . Cancer Sister     No Known Allergies   REVIEW OF SYSTEMS (Negative unless checked)  Constitutional: [] Weight loss  [] Fever  [] Chills Cardiac: [] Chest pain   [] Chest pressure   [] Palpitations   [] Shortness of breath when laying flat   [] Shortness of breath with exertion. Vascular:  [] Pain in legs with  walking   [] Pain in legs at rest  [] History of DVT   [] Phlebitis   [] Swelling in legs   [] Varicose veins   [] Non-healing ulcers Pulmonary:   [] Uses home oxygen   [] Productive cough   [] Hemoptysis   [] Wheeze  [] COPD   [] Asthma Neurologic:  [] Dizziness    [] Seizures   [] History of stroke   [] History of TIA  [] Aphasia   [] Vissual changes   [] Weakness or numbness in arm   [] Weakness or numbness in leg Musculoskeletal:   [] Joint swelling   [] Joint pain   [] Low back pain Hematologic:  [] Easy bruising  [] Easy bleeding   [] Hypercoagulable state   [] Anemic Gastrointestinal:  [] Diarrhea   [] Vomiting  [] Gastroesophageal reflux/heartburn   [] Difficulty swallowing. Genitourinary:  [] Chronic kidney disease   [] Difficult urination  [] Frequent urination   [] Blood in urine Skin:  [] Rashes   [] Ulcers  Psychological:  [] History of anxiety   []  History of major depression.  Physical Examination  There were no vitals filed for this visit. There is no height or weight on file to calculate BMI. Gen: WD/WN, NAD Head: [place]/AT, No temporalis wasting.  Ear/Nose/Throat: Hearing grossly intact, nares w/o erythema or drainage Eyes: PER, EOMI, sclera nonicteric.  Neck: Supple, no large masses.   Pulmonary:  Good air movement, no audible wheezing bilaterally, no use of accessory muscles.  Cardiac: RRR, no JVD Vascular: *** Vessel Right Left  Radial Palpable Palpable  Ulnar Palpable Palpable  Brachial Palpable Palpable  Carotid Palpable Palpable  Femoral Palpable Palpable  Popliteal Palpable Palpable  PT Palpable Palpable  DP Palpable Palpable  Gastrointestinal: Non-distended. No guarding/no peritoneal signs.  Musculoskeletal: M/S 5/5 throughout.  No deformity or atrophy.  Neurologic: CN 2-12 intact. Symmetrical.  Speech is fluent. Motor exam as listed above. Psychiatric: Judgment intact, Mood & affect appropriate for pt's clinical situation. Dermatologic: No rashes or ulcers noted.  No changes consistent with cellulitis. Lymph : No lichenification or skin changes of chronic lymphedema.  CBC No results found for: WBC, HGB, HCT, MCV, PLT  BMET    Component Value Date/Time   BUN 15 06/09/2018 1056   CREATININE 0.73 06/09/2018 1056   GFRNONAA >60 06/09/2018  1056   GFRAA >60 06/09/2018 1056   CrCl cannot be calculated (Patient's most recent lab result is older than the maximum 21 days allowed.).  COAG No results found for: INR, PROTIME  Radiology No results found.  Outside Studies/Documentation *** pages of outside documents were reviewed.  They showed ***.  Assessment/Plan There are no diagnoses linked to this encounter.   Hortencia Pilar, MD  04/05/2019 12:17 PM

## 2019-04-06 ENCOUNTER — Encounter (INDEPENDENT_AMBULATORY_CARE_PROVIDER_SITE_OTHER): Payer: Medicare Other

## 2019-04-06 ENCOUNTER — Ambulatory Visit (INDEPENDENT_AMBULATORY_CARE_PROVIDER_SITE_OTHER): Payer: Medicare Other | Admitting: Vascular Surgery

## 2020-02-15 DIAGNOSIS — I10 Essential (primary) hypertension: Secondary | ICD-10-CM | POA: Diagnosis not present

## 2020-02-15 DIAGNOSIS — E538 Deficiency of other specified B group vitamins: Secondary | ICD-10-CM | POA: Diagnosis not present

## 2020-02-15 DIAGNOSIS — E7800 Pure hypercholesterolemia, unspecified: Secondary | ICD-10-CM | POA: Diagnosis not present

## 2020-02-15 DIAGNOSIS — R4189 Other symptoms and signs involving cognitive functions and awareness: Secondary | ICD-10-CM | POA: Diagnosis not present

## 2020-02-15 DIAGNOSIS — I739 Peripheral vascular disease, unspecified: Secondary | ICD-10-CM | POA: Diagnosis not present

## 2020-02-15 DIAGNOSIS — G3184 Mild cognitive impairment, so stated: Secondary | ICD-10-CM | POA: Diagnosis not present

## 2020-02-15 DIAGNOSIS — J449 Chronic obstructive pulmonary disease, unspecified: Secondary | ICD-10-CM | POA: Diagnosis not present

## 2020-04-07 DIAGNOSIS — G3184 Mild cognitive impairment, so stated: Secondary | ICD-10-CM | POA: Diagnosis not present

## 2020-04-07 DIAGNOSIS — E538 Deficiency of other specified B group vitamins: Secondary | ICD-10-CM | POA: Diagnosis not present

## 2020-04-07 DIAGNOSIS — Z7189 Other specified counseling: Secondary | ICD-10-CM | POA: Diagnosis not present

## 2020-04-07 DIAGNOSIS — E519 Thiamine deficiency, unspecified: Secondary | ICD-10-CM | POA: Diagnosis not present

## 2020-04-08 ENCOUNTER — Other Ambulatory Visit (HOSPITAL_COMMUNITY): Payer: Self-pay | Admitting: Neurology

## 2020-04-08 ENCOUNTER — Other Ambulatory Visit: Payer: Self-pay | Admitting: Neurology

## 2020-04-08 DIAGNOSIS — G3184 Mild cognitive impairment, so stated: Secondary | ICD-10-CM

## 2020-04-19 ENCOUNTER — Other Ambulatory Visit: Payer: Self-pay

## 2020-04-19 ENCOUNTER — Ambulatory Visit (HOSPITAL_COMMUNITY)
Admission: RE | Admit: 2020-04-19 | Discharge: 2020-04-19 | Disposition: A | Discharge status: Home / Self Care | Payer: Medicare HMO | Source: Ambulatory Visit | Attending: Neurology | Admitting: Neurology

## 2020-04-19 DIAGNOSIS — G3184 Mild cognitive impairment, so stated: Secondary | ICD-10-CM | POA: Diagnosis not present

## 2020-04-19 DIAGNOSIS — R9082 White matter disease, unspecified: Secondary | ICD-10-CM | POA: Diagnosis not present

## 2020-04-19 DIAGNOSIS — J320 Chronic maxillary sinusitis: Secondary | ICD-10-CM | POA: Diagnosis not present

## 2020-04-19 DIAGNOSIS — M47812 Spondylosis without myelopathy or radiculopathy, cervical region: Secondary | ICD-10-CM | POA: Diagnosis not present

## 2020-04-19 DIAGNOSIS — J3489 Other specified disorders of nose and nasal sinuses: Secondary | ICD-10-CM | POA: Diagnosis not present

## 2020-07-14 DIAGNOSIS — R2689 Other abnormalities of gait and mobility: Secondary | ICD-10-CM | POA: Diagnosis not present

## 2020-07-14 DIAGNOSIS — G3184 Mild cognitive impairment, so stated: Secondary | ICD-10-CM | POA: Diagnosis not present

## 2020-07-14 DIAGNOSIS — G4733 Obstructive sleep apnea (adult) (pediatric): Secondary | ICD-10-CM | POA: Diagnosis not present

## 2020-07-22 DIAGNOSIS — R32 Unspecified urinary incontinence: Secondary | ICD-10-CM | POA: Diagnosis not present

## 2020-07-22 DIAGNOSIS — G4733 Obstructive sleep apnea (adult) (pediatric): Secondary | ICD-10-CM | POA: Diagnosis not present

## 2020-07-22 DIAGNOSIS — I739 Peripheral vascular disease, unspecified: Secondary | ICD-10-CM | POA: Diagnosis not present

## 2020-07-22 DIAGNOSIS — E7800 Pure hypercholesterolemia, unspecified: Secondary | ICD-10-CM | POA: Diagnosis not present

## 2020-07-22 DIAGNOSIS — F17210 Nicotine dependence, cigarettes, uncomplicated: Secondary | ICD-10-CM | POA: Diagnosis not present

## 2020-07-22 DIAGNOSIS — I10 Essential (primary) hypertension: Secondary | ICD-10-CM | POA: Diagnosis not present

## 2020-07-22 DIAGNOSIS — Z7901 Long term (current) use of anticoagulants: Secondary | ICD-10-CM | POA: Diagnosis not present

## 2020-07-22 DIAGNOSIS — J449 Chronic obstructive pulmonary disease, unspecified: Secondary | ICD-10-CM | POA: Diagnosis not present

## 2020-07-22 DIAGNOSIS — G3184 Mild cognitive impairment, so stated: Secondary | ICD-10-CM | POA: Diagnosis not present

## 2020-08-08 DIAGNOSIS — I10 Essential (primary) hypertension: Secondary | ICD-10-CM | POA: Diagnosis not present

## 2020-08-08 DIAGNOSIS — E7800 Pure hypercholesterolemia, unspecified: Secondary | ICD-10-CM | POA: Diagnosis not present

## 2020-08-08 DIAGNOSIS — E538 Deficiency of other specified B group vitamins: Secondary | ICD-10-CM | POA: Diagnosis not present

## 2020-08-15 DIAGNOSIS — J449 Chronic obstructive pulmonary disease, unspecified: Secondary | ICD-10-CM | POA: Diagnosis not present

## 2020-08-15 DIAGNOSIS — Z0000 Encounter for general adult medical examination without abnormal findings: Secondary | ICD-10-CM | POA: Diagnosis not present

## 2020-08-15 DIAGNOSIS — I10 Essential (primary) hypertension: Secondary | ICD-10-CM | POA: Diagnosis not present

## 2020-08-15 DIAGNOSIS — I739 Peripheral vascular disease, unspecified: Secondary | ICD-10-CM | POA: Diagnosis not present

## 2020-08-15 DIAGNOSIS — G3184 Mild cognitive impairment, so stated: Secondary | ICD-10-CM | POA: Diagnosis not present

## 2020-10-04 DIAGNOSIS — F32A Depression, unspecified: Secondary | ICD-10-CM | POA: Diagnosis not present

## 2020-10-04 DIAGNOSIS — F0391 Unspecified dementia with behavioral disturbance: Secondary | ICD-10-CM | POA: Diagnosis not present

## 2020-11-22 DIAGNOSIS — F0391 Unspecified dementia with behavioral disturbance: Secondary | ICD-10-CM | POA: Diagnosis not present

## 2021-01-02 DEATH — deceased

## 2022-06-23 IMAGING — MR MR HEAD W/O CM
3 series · 48 of 48 positions shown · non-contrast
Comparison: Report from brain MRI 01/22/2002 (images unavailable).

CLINICAL DATA: Mild cognitive impairment.

EXAM:
MRI HEAD WITHOUT CONTRAST
TECHNIQUE: Multiplanar, multiecho pulse sequences of the brain and surrounding
structures were obtained without intravenous contrast.
Additionally, using NeuroQuant software a 3D volumetric analysis of
the brain was performed and is compared to a normative database
adjusted for age, gender and intracranial volume.

[Series 52: nqsegcb_sc_cor · 1.00mm/px · 17 of 235 slices shown]
[im 1/235]
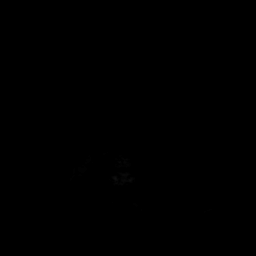
[im 15/235]
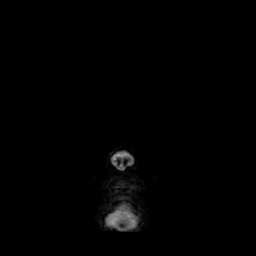
[im 30/235]
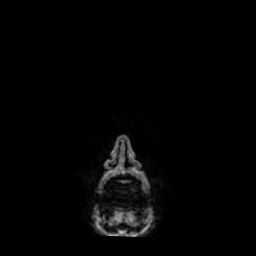
[im 44/235]
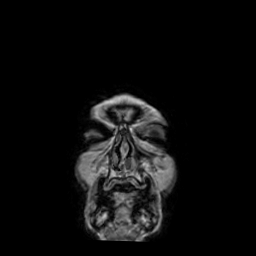
[im 59/235]
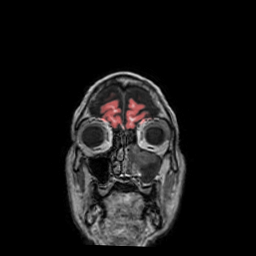
[im 74/235]
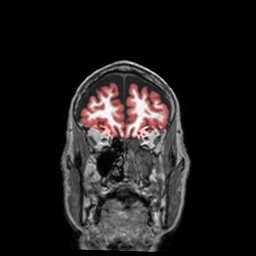
[im 88/235]
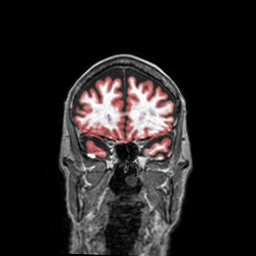
[im 103/235]
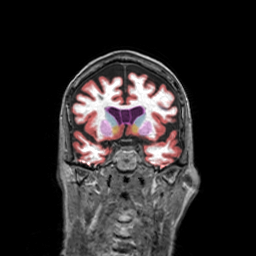
[im 118/235]
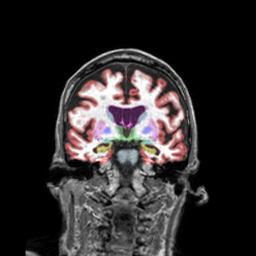
[im 132/235]
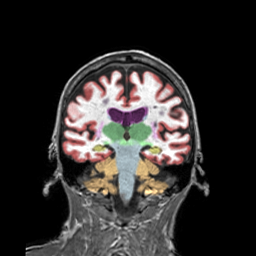
[im 147/235]
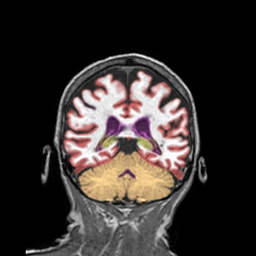
[im 161/235]
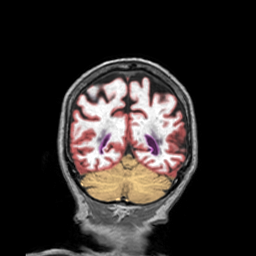
[im 176/235]
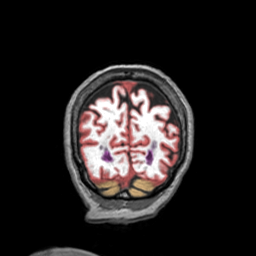
[im 191/235]
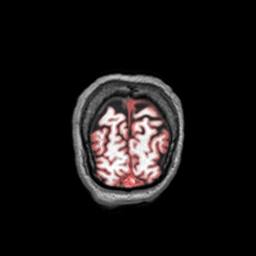
[im 205/235]
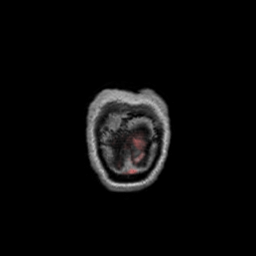
[im 220/235]
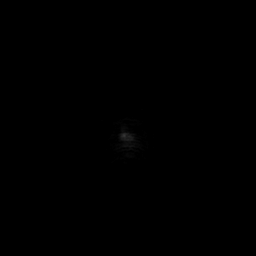
[im 235/235]
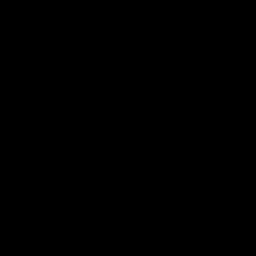

[Series 53: nqsegcb_sc_axl · 1.00mm/px · 16 of 219 slices shown]
[im 1/219]
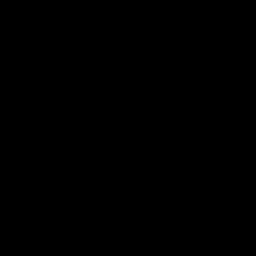
[im 15/219]
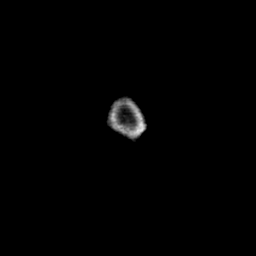
[im 30/219]
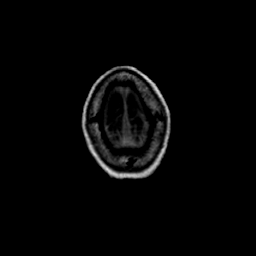
[im 44/219]
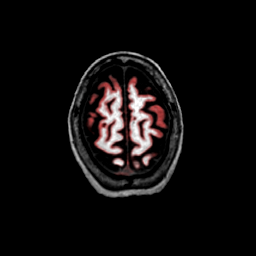
[im 59/219]
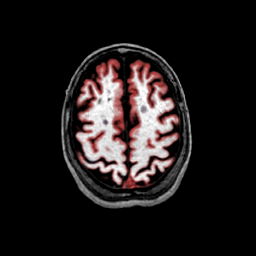
[im 73/219]
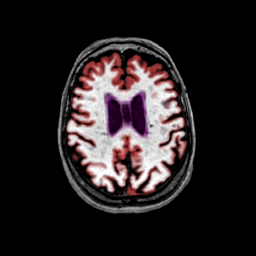
[im 88/219]
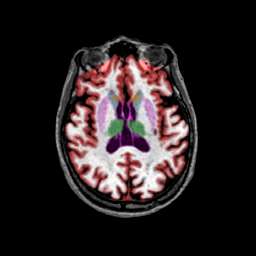
[im 102/219]
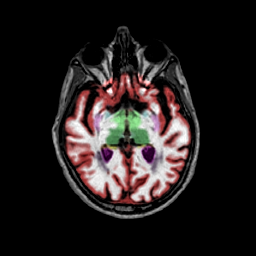
[im 117/219]
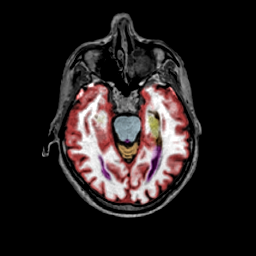
[im 131/219]
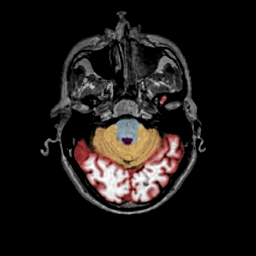
[im 146/219]
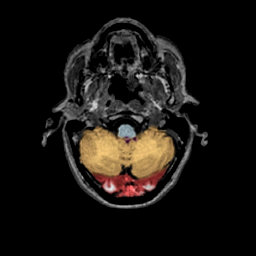
[im 160/219]
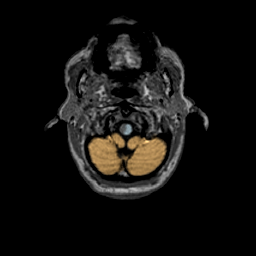
[im 175/219]
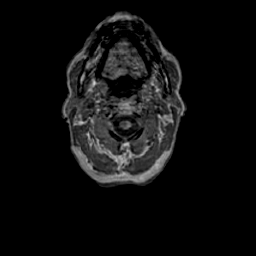
[im 189/219]
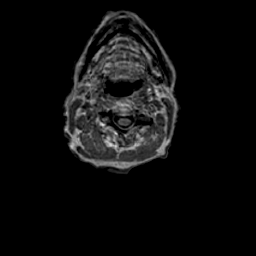
[im 204/219]
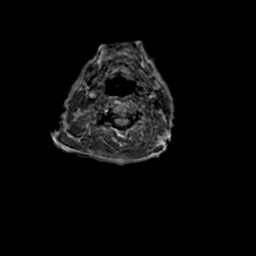
[im 219/219]
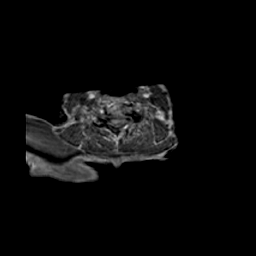

[Series 54: nqsegcb_sc_sag · 1.00mm/px · 15 of 200 slices shown]
[im 1/200]
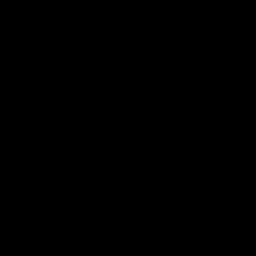
[im 15/200]
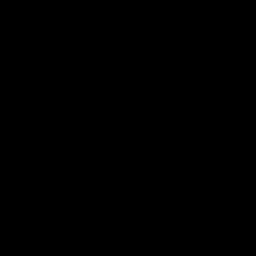
[im 29/200]
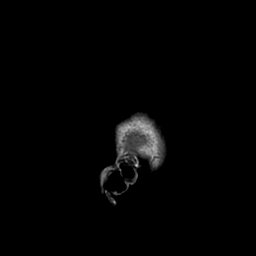
[im 43/200]
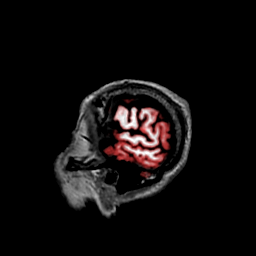
[im 57/200]
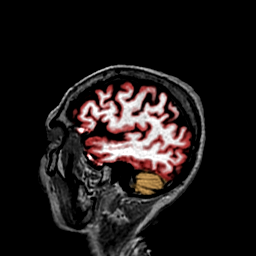
[im 72/200]
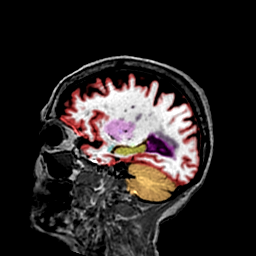
[im 86/200]
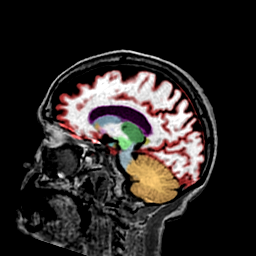
[im 100/200]
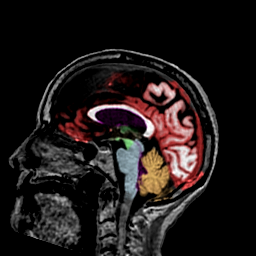
[im 114/200]
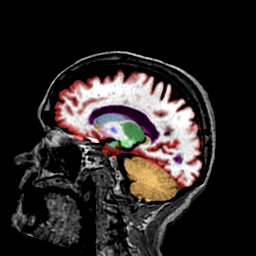
[im 128/200]
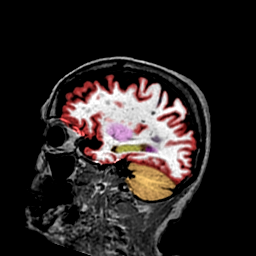
[im 143/200]
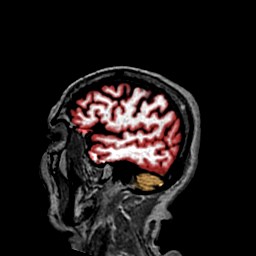
[im 157/200]
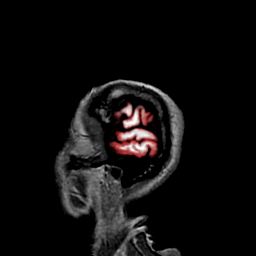
[im 171/200]
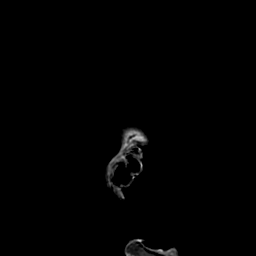
[im 185/200]
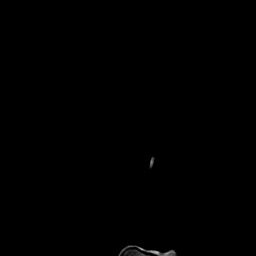
[im 200/200]
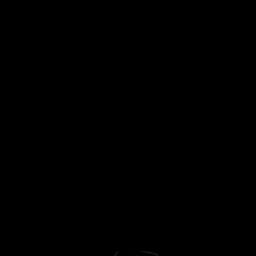

[48 of 48 positions shown; findings below may reference images not displayed]

FINDINGS: Brain:

Moderate multifocal T2/FLAIR hyperintensity within the subcortical
and deep periventricular cerebral white matter. Some of these foci
are oriented perpendicular to the lateral ventricles.

There is no acute infarct.

No evidence of intracranial mass.

No chronic intracranial blood products.

No extra-axial fluid collection.

No midline shift.

Vascular: Expected proximal arterial flow voids.

Skull and upper cervical spine: No focal marrow lesion. Cervical
spondylosis.

Sinuses/Orbits: Visualized orbits show no acute finding. Complete
opacification of the left maxillary sinus. Extensive partial
opacification of posterior left ethmoid air cells. Polypoid soft
tissue within the left nasal passage.

NeuroQuant Findings:

Volumetric analysis of the brain was performed, with a fully
detailed report in [HOSPITAL] PACS. Briefly, the comparison with age and
gender matched reference reveals a whole brain normative percentile
of 79.
IMPRESSION: Moderate multifocal T2 hyperintense signal abnormality within the
subcortical and deep periventricular cerebral white matter. Findings
are nonspecific and may reflect sequela of chronic small vessel
ischemia. However, there are some features to suggest demyelinating
disease and clinical correlation is recommended.

NeuroQuant volumetric analysis of the brain, see details on [HOSPITAL]
PACS. Briefly, the comparison with age and gender matched reference
reveals a whole brain normative percentile of 79.

Left maxillary and ethmoid sinusitis as described.

Polypoid soft tissue within the left nasal passage. Direct
visualization is recommended.
